# Patient Record
Sex: Male | Born: 1965 | Race: White | Hispanic: No | Marital: Married | Smoking: Never smoker
Health system: Southern US, Community
[De-identification: ages and names within clinical notes are randomized; demographics above are authoritative.]

## PROBLEM LIST (undated history)

## (undated) DIAGNOSIS — G4700 Insomnia, unspecified: Secondary | ICD-10-CM

## (undated) DIAGNOSIS — S5290XA Unspecified fracture of unspecified forearm, initial encounter for closed fracture: Secondary | ICD-10-CM

## (undated) DIAGNOSIS — N200 Calculus of kidney: Secondary | ICD-10-CM

## (undated) DIAGNOSIS — N138 Other obstructive and reflux uropathy: Secondary | ICD-10-CM

## (undated) DIAGNOSIS — N401 Enlarged prostate with lower urinary tract symptoms: Secondary | ICD-10-CM

## (undated) DIAGNOSIS — I10 Essential (primary) hypertension: Secondary | ICD-10-CM

## (undated) DIAGNOSIS — G43909 Migraine, unspecified, not intractable, without status migrainosus: Secondary | ICD-10-CM

## (undated) HISTORY — DX: Insomnia, unspecified: G47.00

## (undated) HISTORY — PX: CERVICAL DISCECTOMY: SHX98

## (undated) HISTORY — DX: Unspecified fracture of unspecified forearm, initial encounter for closed fracture: S52.90XA

## (undated) HISTORY — DX: Migraine, unspecified, not intractable, without status migrainosus: G43.909

## (undated) HISTORY — DX: Other obstructive and reflux uropathy: N13.8

## (undated) HISTORY — DX: Benign prostatic hyperplasia with lower urinary tract symptoms: N40.1

## (undated) HISTORY — DX: Calculus of kidney: N20.0

## (undated) HISTORY — DX: Essential (primary) hypertension: I10

---

## 2001-07-23 ENCOUNTER — Encounter: Payer: Self-pay | Admitting: Neurosurgery

## 2001-07-23 ENCOUNTER — Ambulatory Visit (HOSPITAL_COMMUNITY): Admission: RE | Admit: 2001-07-23 | Discharge: 2001-07-23 | Payer: Self-pay | Admitting: Neurosurgery

## 2001-08-24 ENCOUNTER — Encounter: Payer: Self-pay | Admitting: Neurosurgery

## 2001-08-24 ENCOUNTER — Encounter: Admission: RE | Admit: 2001-08-24 | Discharge: 2001-08-24 | Payer: Self-pay | Admitting: Neurosurgery

## 2001-09-16 ENCOUNTER — Encounter: Admission: RE | Admit: 2001-09-16 | Discharge: 2001-09-16 | Payer: Self-pay | Admitting: Neurosurgery

## 2001-09-16 ENCOUNTER — Encounter: Payer: Self-pay | Admitting: Neurosurgery

## 2001-10-01 ENCOUNTER — Encounter: Payer: Self-pay | Admitting: Neurosurgery

## 2001-10-01 ENCOUNTER — Encounter: Admission: RE | Admit: 2001-10-01 | Discharge: 2001-10-01 | Payer: Self-pay | Admitting: Neurosurgery

## 2004-12-23 ENCOUNTER — Encounter: Admission: RE | Admit: 2004-12-23 | Discharge: 2004-12-23 | Payer: Self-pay | Admitting: Neurosurgery

## 2006-01-16 ENCOUNTER — Ambulatory Visit: Payer: Self-pay | Admitting: Family Medicine

## 2006-12-09 ENCOUNTER — Telehealth: Payer: Self-pay | Admitting: Family Medicine

## 2007-06-01 ENCOUNTER — Telehealth: Payer: Self-pay | Admitting: Family Medicine

## 2007-06-30 ENCOUNTER — Ambulatory Visit: Payer: Self-pay | Admitting: Family Medicine

## 2007-06-30 ENCOUNTER — Telehealth: Payer: Self-pay | Admitting: Family Medicine

## 2007-06-30 DIAGNOSIS — G4700 Insomnia, unspecified: Secondary | ICD-10-CM

## 2007-07-06 ENCOUNTER — Encounter: Payer: Self-pay | Admitting: Family Medicine

## 2007-07-20 ENCOUNTER — Telehealth: Payer: Self-pay | Admitting: Family Medicine

## 2007-07-29 ENCOUNTER — Encounter: Admission: RE | Admit: 2007-07-29 | Discharge: 2007-07-29 | Payer: Self-pay | Admitting: Neurosurgery

## 2007-08-20 ENCOUNTER — Encounter: Admission: RE | Admit: 2007-08-20 | Discharge: 2007-08-20 | Payer: Self-pay | Admitting: Neurosurgery

## 2007-09-14 ENCOUNTER — Inpatient Hospital Stay (HOSPITAL_COMMUNITY): Admission: RE | Admit: 2007-09-14 | Discharge: 2007-09-16 | Payer: Self-pay | Admitting: Neurosurgery

## 2007-12-08 ENCOUNTER — Telehealth: Payer: Self-pay | Admitting: Family Medicine

## 2008-05-04 ENCOUNTER — Telehealth: Payer: Self-pay | Admitting: Family Medicine

## 2008-11-16 IMAGING — RF DG CERVICAL SPINE 2 OR 3 VIEWS
1 series · 2 of 2 positions shown · non-contrast
Comparison: None

CLINICAL DATA: C5-6 disc arthroplasty; cervical DDD

CERVICAL SPINE - 2-3 VIEW

[Series 0: run · 2 of 2 slices shown]
[im 1/2]
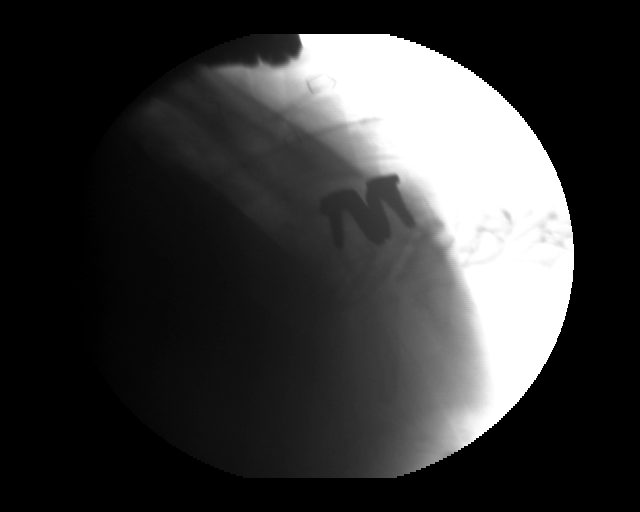
[im 2/2]
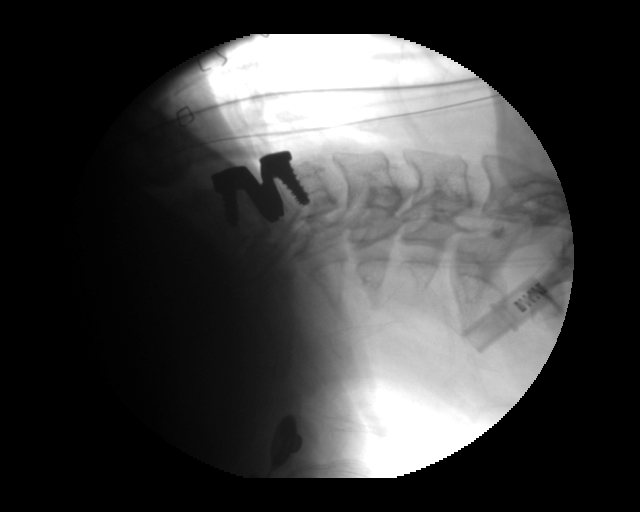

[2 of 2 positions shown; findings below may reference images not displayed]

FINDINGS: Two fluoroscopic-guided images from the operating room in
the lateral projection reveal changes of C5-6 disc arthroplasty
with screws entering the C5 and C6 bodies and prong-like
projections extending into the interspace.
IMPRESSION: Status post placement of C5-C6 body screws with disc implant.

## 2008-11-21 ENCOUNTER — Telehealth: Payer: Self-pay | Admitting: Family Medicine

## 2009-06-07 ENCOUNTER — Ambulatory Visit: Payer: Self-pay | Admitting: Family Medicine

## 2009-06-07 DIAGNOSIS — B029 Zoster without complications: Secondary | ICD-10-CM | POA: Insufficient documentation

## 2009-06-07 DIAGNOSIS — G43909 Migraine, unspecified, not intractable, without status migrainosus: Secondary | ICD-10-CM

## 2009-06-27 ENCOUNTER — Telehealth: Payer: Self-pay | Admitting: Family Medicine

## 2010-01-14 ENCOUNTER — Telehealth: Payer: Self-pay | Admitting: Family Medicine

## 2010-02-21 ENCOUNTER — Telehealth: Payer: Self-pay | Admitting: *Deleted

## 2010-04-09 NOTE — Assessment & Plan Note (Signed)
Summary: rash   Vital Signs:  Patient profile:   45 year old male Weight:      209 pounds Temp:     97.9 degrees F oral BP sitting:   116 / 84  (left arm) Cuff size:   regular  Vitals Entered By: Raechel Ache, RN (June 07, 2009 9:29 AM) CC: Developed achiness last week and skin tenderness L side; 3 days ago broke out in sore blisters on side. 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   History of Present Illness: Here for 4 days of an itchy burning rash on the left flank. Also he needs refills on meds.   Allergies: 1)  ! Penicillin  Past History:  Past Medical History: fx lt forearm insomnia migraines  Review of Systems  The patient denies anorexia, fever, weight loss, weight gain, vision loss, decreased hearing, hoarseness, chest pain, syncope, dyspnea on exertion, peripheral edema, prolonged cough, headaches, hemoptysis, abdominal pain, melena, hematochezia, severe indigestion/heartburn, hematuria, incontinence, genital sores, muscle weakness, suspicious skin lesions, transient blindness, difficulty walking, depression, unusual weight change, abnormal bleeding, enlarged lymph nodes, angioedema, breast masses, and testicular masses.    Physical Exam  General:  Well-developed,well-nourished,in no acute distress; alert,appropriate and cooperative throughout examination Lungs:  Normal respiratory effort, chest expands symmetrically. Lungs are clear to auscultation, no crackles or wheezes. Heart:  Normal rate and regular rhythm. S1 and S2 normal without gallop, murmur, click, rub or other extra sounds. Skin:  small patch of erythema on the left flank with a few vessicles   Impression & Recommendations:  Problem # 1:  SHINGLES (ICD-053.9)  Problem # 2:  INSOMNIA (ICD-780.52)  His updated medication list for this problem includes:    Temazepam 30 Mg Caps (Temazepam) .Marland Kitchen... 1 at bedtime prn  Problem # 3:  MIGRAINE HEADACHE (ICD-346.90)  His updated medication  list for this problem includes:    Imitrex 100 Mg Tabs (Sumatriptan succinate) ..... Once daily as needed  Complete Medication List: 1)  Imitrex 100 Mg Tabs (Sumatriptan succinate) .... Once daily as needed 2)  Nasonex 50 Mcg/act Susp (Mometasone furoate) .Marland Kitchen.. 1-2 each nostril qd 3)  Flonase 50 Mcg/act Susp (Fluticasone propionate) .... 2 sprays each day 4)  Temazepam 30 Mg Caps (Temazepam) .Marland Kitchen.. 1 at bedtime prn 5)  Prednisone (pak) 10 Mg Tabs (Prednisone) .... As directed for 12 days 6)  Valtrex 1 Gm Tabs (Valacyclovir hcl) .... Three times a day  Patient Instructions: 1)  This is shingles. Start on Valtrex and Prednisone. Refilled his other meds. Prescriptions: TEMAZEPAM 30 MG CAPS (TEMAZEPAM) 1 at bedtime prn  #30 x 5   Entered and Authorized by:   Nelwyn Salisbury MD   Signed by:   Nelwyn Salisbury MD on 06/07/2009   Method used:   Print then Give to Patient   RxID:   [id number] NASONEX 50 MCG/ACT SUSP (MOMETASONE FUROATE) 1-2 each nostril qd  #30 x 5   Entered and Authorized by:   Nelwyn Salisbury MD   Signed by:   Nelwyn Salisbury MD on 06/07/2009   Method used:   Print then Give to Patient   RxID:   [id number] IMITREX 100 MG TABS (SUMATRIPTAN SUCCINATE) once daily as needed  #12 x 5   Entered and Authorized by:   Nelwyn Salisbury MD   Signed by:   Nelwyn Salisbury MD on 06/07/2009   Method used:   Print then Give to Patient   RxID:  [id number] VALTREX 1 GM TABS (VALACYCLOVIR HCL) three times a day  #21 x 0   Entered and Authorized by:   Nelwyn Salisbury MD   Signed by:   Nelwyn Salisbury MD on 06/07/2009   Method used:   Print then Give to Patient   RxID:   [id number] PREDNISONE (PAK) 10 MG TABS (PREDNISONE) as directed for 12 days  #1 x 0   Entered and Authorized by:   Nelwyn Salisbury MD   Signed by:   Nelwyn Salisbury MD on 06/07/2009   Method used:   Print then Give to Patient   RxID:   [id number]

## 2010-04-09 NOTE — Progress Notes (Signed)
Summary: refill  Phone Note From Pharmacy   [facility] Call For: Jeffrey Kennedy  Summary of Call: refill temazepam 30mg  1 by mouth at bedtime as needed  Initial call taken by: Alfred Levins, CMA,  June 01, 2007 8:15 AM  Follow-up for Phone Call        call in #30 with 5 rf Follow-up by: Nelwyn Salisbury MD,  June 01, 2007 10:16 AM  Additional Follow-up for Phone Call Additional follow up Details #1::        Phone call completed, Pharmacist called Additional Follow-up by: Alfred Levins, CMA,  June 01, 2007 10:58 AM    New/Updated Medications: TEMAZEPAM 30 MG CAPS (TEMAZEPAM) 1 at bedtime prn   Prescriptions: TEMAZEPAM 30 MG CAPS (TEMAZEPAM) 1 at bedtime prn  #30 x 5   Entered by:   Alfred Levins, CMA   Authorized by:   Nelwyn Salisbury MD   Signed by:   Alfred Levins, CMA on 06/01/2007   Method used:   Telephoned to ...       [facility]       [address]       [place], [place]  [address]       Ph: [phone]       Fax: [phone]   RxID:   [id number]

## 2010-04-09 NOTE — Progress Notes (Signed)
Summary: refill ambien cr  Phone Note Call from Patient   Caller: Patient Call For: Nelwyn Salisbury MD Summary of Call: needs refill called to pharmacy instead of mail order- Ambien CR [facility] [place] [phone] Initial call taken by: Raechel Ache, RN,  June 27, 2009 4:48 PM  Follow-up for Phone Call        call in Ambien CR 12.5 at bedtime , #30 with 5 rf Follow-up by: Nelwyn Salisbury MD,  July 02, 2009 1:19 PM  Additional Follow-up for Phone Call Additional follow up Details #1::        Rx Called In Additional Follow-up by: Raechel Ache, RN,  July 02, 2009 1:44 PM    New/Updated Medications: AMBIEN CR 12.5 MG CR-TABS (ZOLPIDEM TARTRATE) 1 at bedtime Prescriptions: AMBIEN CR 12.5 MG CR-TABS (ZOLPIDEM TARTRATE) 1 at bedtime  #30 x 5   Entered by:   Raechel Ache, RN   Authorized by:   Nelwyn Salisbury MD   Signed by:   Raechel Ache, RN on 07/02/2009   Method used:   Historical   RxID:   [id number]

## 2010-04-09 NOTE — Progress Notes (Signed)
Summary: rx temazepam/ ambien  Phone Note Call from Patient   Caller: Spouse Summary of Call: wants temazapam refill and refill zolpidem er 12.5 mg  already taking [place]   but wife states he swithes between the both.  [facility]  Initial call taken by: Pura Spice, RN,  January 14, 2010 4:02 PM  Follow-up for Phone Call        no I cannot prescribe both of these, just one or the other. Which one works best for him? Follow-up by: Nelwyn Salisbury MD,  January 15, 2010 1:16 PM  Additional Follow-up for Phone Call Additional follow up Details #1::        mess left to return call  Additional Follow-up by: Pura Spice, RN,  January 15, 2010 2:46 PM    Additional Follow-up for Phone Call Additional follow up Details #2::    LEFT MESS TO RETURN CALL  Follow-up by: Pura Spice, RN,  January 15, 2010 5:15 PM  Additional Follow-up for Phone Call Additional follow up Details #3:: Details for Additional Follow-up Action Taken: pt wants Liborio Nixon, call in Ambien #30 with 5 rf     SF called pt  notified. .... Additional Follow-up by: Pura Spice, RN,  January 16, 2010 9:46 AM  New/Updated Medications: AMBIEN CR 12.5 MG CR-TABS (ZOLPIDEM TARTRATE) 1 at bedtime Prescriptions: AMBIEN CR 12.5 MG CR-TABS (ZOLPIDEM TARTRATE) 1 at bedtime  #30 x 5   Entered by:   Pura Spice, RN   Authorized by:   Nelwyn Salisbury MD   Signed by:   Pura Spice, RN on 01/16/2010   Method used:   Telephoned to ...       [facility] ([facility]ail)       [address]       [place], [place]  [address]       Ph: [phone]       Fax: [phone]   RxID:   [id number]

## 2010-04-11 NOTE — Progress Notes (Signed)
  Phone Note Call from Patient   Caller: Patient Call For: Nelwyn Salisbury MD Summary of Call: [facility] [phone] Pt alternates Ambien/Temazepam...........states he and Dr. Clent Ridges discussed this.  Needs refills on Temazepam. [phone] Initial call taken by: [facility] CMA AAMA,  February 21, 2010 11:42 AM  Follow-up for Phone Call        done Follow-up by: Nelwyn Salisbury MD,  February 21, 2010 11:59 AM  Additional Follow-up for Phone Call Additional follow up Details #1::        Rx is up front. Left message on machine about this. Additional Follow-up by: Romualdo Bolk, CMA (AAMA),  February 21, 2010 4:20 PM    New/Updated Medications: TEMAZEPAM 30 MG CAPS (TEMAZEPAM) at bedtime Prescriptions: TEMAZEPAM 30 MG CAPS (TEMAZEPAM) at bedtime  #30 x 5   Entered and Authorized by:   Nelwyn Salisbury MD   Signed by:   Nelwyn Salisbury MD on 02/21/2010   Method used:   Print then Give to Patient   RxID:   [id number]

## 2010-07-23 NOTE — H&P (Signed)
NAME:  Jeffrey Kennedy, Jeffrey Kennedy NO.:  [id number]   MEDICAL RECORD NO.:  [id number]          PATIENT TYPE:   LOCATION:                                 FACILITY:   PHYSICIAN:  Jeffrey Kennedy, M.D.           DATE OF BIRTH:   DATE OF ADMISSION:  09/14/2007  DATE OF DISCHARGE:                              HISTORY & PHYSICAL   ADMITTING DIAGNOSIS:  Cervical spondylosis, C5-C6.   HISTORY OF PRESENT ILLNESS:  This is a 45 year old right-handed white  gentleman who had been following since May 2003.  He had cervical  spondylosis of disk.  He has developed more neck pain and increased  spondylosis at C5-C6, and he is now admitted for disk arthroplasty.  His  medical history is fairly benign.  He has no long term medication, had  no operations.   ALLERGIES:  PENICILLIN.   SURGICAL HISTORY:  None.   SOCIAL HISTORY:  He does not smoke, drinks only socially.  He is a Occupational hygienist  for [employer].   FAMILY HISTORY:  Mother is 29 and okay health.  Father's history is not  given.   REVIEW OF SYSTEMS:  Remarkable for arm pain and neck pain.   PHYSICAL EXAMINATION:  HEENT:  Within Normal limits.  NECK:  He has reasonable range of motion of his neck and does not seem  to reproduce his shoulder and arm pain.  CHEST:  Clear.  CARDIAC:  Regular rate and rhythm.  ABDOMEN:  Nontender.  No hepatosplenomegaly.  GU:  Deferred.  EXTREMITIES: No clubbing or cyanosis. Peripheral pulses are good.  NEUROLOGICALLY:  He is awake, alert, and oriented.  Cranial nerves are  intact.  Motor exam shows 5/5 strength throughout the upper extremities.  There is no current sensory deficit.  The dysesthesia is described in  the left C5-C6 distribution.  Reflexes are 1 on the biceps, 1 on the  triceps, 1 on the brachioradialis.  Mikey Bussing is negative.  The lower  extremities are nonmyelopathic.   MRI shows spondylitic disease at C5-C6 with preserved motion.  He has  mild disk disease at C5-C6 and C6-C7 and in fact  herniated disk at C6-C7  that was managed conservatively, and has done well.  Major culprit is C5-  C6.   PLAN:  Disk arthroplasty, hopefully by preserving the motion, we will  prevent progression of transitional segment disease at C4-C5 and C6-C7.  The risks and benefits of this approach have been discussed with him,  and he wishes to proceed.           ______________________________  Jeffrey Kennedy, M.D.     MWR/MEDQ  D:  09/14/2007  T:  09/14/2007  Job:  [id number]

## 2010-07-23 NOTE — Op Note (Signed)
NAMESTEPHAN, NELIS NO.:  [id number]   MEDICAL RECORD NO.:  [id number]          PATIENT TYPE:  OIB   LOCATION:  3009                         FACILITY:  [facility]   PHYSICIAN:  Payton Doughty, M.D.      DATE OF BIRTH:  [birth year]   DATE OF PROCEDURE:  09/14/2007  DATE OF DISCHARGE:                               OPERATIVE REPORT   PREOPERATIVE DIAGNOSIS:  C5-6 spondylosis.   POSTOPERATIVE DIAGNOSIS:  C5-6 spondylosis.   OPERATIVE PROCEDURE:  C5-6 arthroplasty.   SURGEON:  Trey Sailors, MD   ANESTHESIA:  General endotracheal.   PREPARATION:  Prepped and draped with alcohol wipe.   COMPLICATIONS:  None.   NURSE ASSISTANT:  [place].   DOCTOR ASSISTANCE:  Hewitt Shorts, MD   BODY OF TEXT:  A 45 year old gentleman with spondylosis at C5-6 taken to  the operating room smoothly anesthetized and intubated, placed supine on  the operating table.  The neck very slightly extended in 5 pounds of  halter head traction.  Following shave, prep and drape in the usual  sterile fashion, the skin was incised from the midline in the medial  border of the sternocleidomastoid muscle at about the level of the  carotid tubercle.  The platysma was identified, elevated by and  undermined.  Sternocleidomastoid was identified.  Medial dissection  revealed the carotid artery retracted laterally to the left, trachea and  esophagus retracted laterally to the right exposing the bones of the  anterior cervical spine.  Marker was placed and fluoroscopy obtained to  confirm correctness level.  Diskectomy was carried out under gross  observation.  The microscope was then brought in.  We used  microdissection to remove bilaterally the posterior osteophytes and get  out into the neural foramina.  Posterior longitudinal ligament and  posterior annulus were also divided.  Retraction pins were placed in the  Caspar retractor and slight retraction was provided.  A 6 x 14 mm trial  was placed and  found to have a good fit, some trimming of osteophytes  was necessary.  Once the trial was placed, the shavers were introduced  and found to have good configuration of the anterior vertebral bodies.  A 6 x 14 mm prestige disk replacement was then placed.  Screws were  attached.  Intraoperative fluoroscopy showed good placement.  Final  tightener was applied.  The entire area was irrigated and hemostasis  assured.  Successive layers of 3-0 Vicryl and 4-0 Vicryl were used to  close.  Benzoin and Steri-Strips were placed and made occlusive Telfa  and OpSite and the patient returned to recovery room in good condition.   ADDENDUM:  Prior to closure of the esophagus, it was carefully inspected  and found to be free of lesions.   .           ______________________________  Payton Doughty, M.D.     MWR/MEDQ  D:  09/14/2007  T:  09/15/2007  Job:  [id number]

## 2010-11-15 ENCOUNTER — Telehealth: Payer: Self-pay | Admitting: Family Medicine

## 2010-11-15 MED ORDER — MOMETASONE FUROATE 50 MCG/ACT NA SUSP: 2.0000 | Freq: Every day | NASAL | Status: DC

## 2010-11-15 NOTE — Telephone Encounter (Signed)
Call in #30 with no refills. He needs an OV for any more after that

## 2010-11-15 NOTE — Telephone Encounter (Signed)
Refill request for Temazepam 30 mg take 1 po qhs. Pt last here on 04/09/10 and script last filled on 08/07/10.

## 2010-11-18 ENCOUNTER — Telehealth: Payer: Self-pay | Admitting: Family Medicine

## 2010-11-18 MED ORDER — TEMAZEPAM 30 MG PO CAPS: 30.0000 mg | ORAL_CAPSULE | Freq: Every evening | ORAL | Status: DC | PRN

## 2010-11-18 NOTE — Telephone Encounter (Signed)
Scripts called in and pt aware.

## 2010-11-18 NOTE — Telephone Encounter (Signed)
Script called in and pt aware. 

## 2010-12-05 LAB — COMPREHENSIVE METABOLIC PANEL
AST: 30
Albumin: 4.3
Calcium: 9.8
Creatinine, Ser: 1.09
GFR calc Af Amer: >60
GFR calc non Af Amer: >60
Total Protein: 7

## 2010-12-05 LAB — CBC
MCHC: 33.9
MCV: 89.6
Platelets: 235
RDW: 14.6

## 2010-12-05 LAB — URINALYSIS, ROUTINE W REFLEX MICROSCOPIC
Glucose, UA: NEGATIVE
Protein, ur: NEGATIVE

## 2010-12-05 LAB — URINE MICROSCOPIC-ADD ON

## 2010-12-05 LAB — DIFFERENTIAL
Eosinophils Relative: 2
Lymphocytes Relative: 34
Lymphs Abs: 2.5
Monocytes Absolute: 0.6
Monocytes Relative: 7

## 2010-12-05 LAB — PROTIME-INR: Prothrombin Time: 11.6

## 2011-12-23 ENCOUNTER — Ambulatory Visit: Payer: Self-pay | Admitting: Family Medicine

## 2011-12-23 ENCOUNTER — Encounter: Payer: Self-pay | Admitting: Family Medicine

## 2011-12-23 DIAGNOSIS — Z0289 Encounter for other administrative examinations: Secondary | ICD-10-CM

## 2012-01-08 ENCOUNTER — Ambulatory Visit: Payer: Self-pay | Admitting: Family Medicine

## 2012-01-26 ENCOUNTER — Ambulatory Visit: Payer: Self-pay | Admitting: Family Medicine

## 2012-02-02 ENCOUNTER — Encounter: Payer: Self-pay | Admitting: Family Medicine

## 2012-02-02 ENCOUNTER — Ambulatory Visit (INDEPENDENT_AMBULATORY_CARE_PROVIDER_SITE_OTHER): Payer: Managed Care, Other (non HMO) | Admitting: Family Medicine

## 2012-02-02 VITALS — BP 180/120 | HR 92 | Temp 98.6°F | Wt 208.0 lb

## 2012-02-02 DIAGNOSIS — R6889 Other general symptoms and signs: Secondary | ICD-10-CM

## 2012-02-02 DIAGNOSIS — I10 Essential (primary) hypertension: Secondary | ICD-10-CM

## 2012-02-02 DIAGNOSIS — R7989 Other specified abnormal findings of blood chemistry: Secondary | ICD-10-CM

## 2012-02-02 LAB — POCT URINALYSIS DIPSTICK
Glucose, UA: NEGATIVE
Nitrite, UA: NEGATIVE
Urobilinogen, UA: 0.2

## 2012-02-02 MED ORDER — METOPROLOL SUCCINATE ER 50 MG PO TB24: 50.0000 mg | ORAL_TABLET | Freq: Every day | ORAL | Status: DC

## 2012-02-02 MED ORDER — ZOLPIDEM TARTRATE 10 MG PO TABS: 10.0000 mg | ORAL_TABLET | Freq: Every evening | ORAL | Status: DC | PRN

## 2012-02-02 NOTE — Progress Notes (Signed)
  Subjective:    Patient ID: Jeffrey Kennedy, male    DOB: Jul 25, 1965, 46 y.o.   MRN: [id number]  HPI Here for elevated BP. We have not seen him in about 2 years, and his BP was fine at his last visit. However he has been told that his BP is quite high over the past 2 months. 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 He has barely been passed this year, but frequent high BP readings have been found. He admits to feeling a lot of stress lately. He denies any SOB or chest pains or HAs.    Review of Systems  Constitutional: Negative.   HENT: Negative.   Eyes: Negative.   Respiratory: Negative.   Cardiovascular: Negative.   Neurological: Negative.        Objective:   Physical Exam  Constitutional: He is oriented to person, place, and time. He appears well-developed and well-nourished.  Eyes: Conjunctivae normal are normal. Pupils are equal, round, and reactive to light.  Neck: No thyromegaly present.  Cardiovascular: Normal rate, regular rhythm, normal heart sounds and intact distal pulses.   Pulmonary/Chest: Effort normal and breath sounds normal.  Lymphadenopathy:    He has no cervical adenopathy.  Neurological: He is alert and oriented to person, place, and time.          Assessment & Plan:  He definitely has HTN and this must be treated. He does not use tobacco. He needs to get more exercise and to limit his sodium intake. Start on Metoprolol 50 mg daily. Recheck in 2 weeks. Get labs today

## 2012-02-03 LAB — CBC WITH DIFFERENTIAL/PLATELET
Basophils Absolute: 0.2 10*3/uL — ABNORMAL HIGH (ref 0.0–0.1)
Eosinophils Relative: 1.8 % (ref 0.0–5.0)
Monocytes Absolute: 0.5 10*3/uL (ref 0.1–1.0)
Monocytes Relative: 5.2 % (ref 3.0–12.0)
Neutrophils Relative %: 58.7 % (ref 43.0–77.0)
Platelets: 275 10*3/uL (ref 150.0–400.0)
WBC: 9.5 10*3/uL (ref 4.5–10.5)

## 2012-02-03 LAB — HEPATIC FUNCTION PANEL
Albumin: 4.1 g/dL (ref 3.5–5.2)
Bilirubin, Direct: 0.1 mg/dL (ref 0.0–0.3)
Total Protein: 7.4 g/dL (ref 6.0–8.3)

## 2012-02-03 LAB — BASIC METABOLIC PANEL
CO2: 30 mEq/L (ref 19–32)
Calcium: 9.4 mg/dL (ref 8.4–10.5)
Creatinine, Ser: 1.7 mg/dL — ABNORMAL HIGH (ref 0.4–1.5)
Glucose, Bld: 99 mg/dL (ref 70–99)

## 2012-02-03 LAB — TSH: TSH: 0.23 u[IU]/mL — ABNORMAL LOW (ref 0.35–5.50)

## 2012-02-04 ENCOUNTER — Encounter: Payer: Self-pay | Admitting: Family Medicine

## 2012-02-04 NOTE — Addendum Note (Signed)
Addended by: Gershon Crane A on: 02/04/2012 12:44 PM   Modules accepted: Orders

## 2012-02-04 NOTE — Progress Notes (Signed)
Quick Note:  I put new lab order in computer, left voice message for pt to schedule lab appointment and I put a copy of results in mail. ______

## 2012-02-04 NOTE — Addendum Note (Signed)
Addended by: Aniceto Boss A on: 02/04/2012 01:02 PM   Modules accepted: Orders

## 2012-02-18 ENCOUNTER — Encounter: Payer: Self-pay | Admitting: Family Medicine

## 2012-02-18 ENCOUNTER — Ambulatory Visit (INDEPENDENT_AMBULATORY_CARE_PROVIDER_SITE_OTHER): Payer: Managed Care, Other (non HMO) | Admitting: Family Medicine

## 2012-02-18 VITALS — BP 150/98 | HR 66 | Temp 98.5°F | Wt 206.0 lb

## 2012-02-18 DIAGNOSIS — R7989 Other specified abnormal findings of blood chemistry: Secondary | ICD-10-CM

## 2012-02-18 DIAGNOSIS — R6889 Other general symptoms and signs: Secondary | ICD-10-CM

## 2012-02-18 DIAGNOSIS — I10 Essential (primary) hypertension: Secondary | ICD-10-CM

## 2012-02-18 MED ORDER — METOPROLOL SUCCINATE ER 100 MG PO TB24: 100.0000 mg | ORAL_TABLET | Freq: Every day | ORAL | Status: DC

## 2012-02-19 ENCOUNTER — Encounter: Payer: Self-pay | Admitting: Family Medicine

## 2012-02-19 DIAGNOSIS — I10 Essential (primary) hypertension: Secondary | ICD-10-CM | POA: Insufficient documentation

## 2012-02-19 LAB — T3, FREE: T3, Free: 3.3 pg/mL (ref 2.3–4.2)

## 2012-02-19 LAB — T4, FREE: Free T4: 1.01 ng/dL (ref 0.60–1.60)

## 2012-02-19 NOTE — Progress Notes (Signed)
  Subjective:    Patient ID: Jeffrey Kennedy, male    DOB: [birth year], 46 y.o.   MRN: [id number]  HPI Here to follow up on HTN. We met 2 weeks ago when his BP was extremely high, up to 180/120. He was started on Metoprolol succinate, and he feels much better now. He feels more relaxed and he is sleeping well on Ambien. His labs were normal except for a slightly depressed TSH.    Review of Systems  Constitutional: Negative.   Respiratory: Negative.   Cardiovascular: Negative.   Neurological: Negative.        Objective:   Physical Exam  Constitutional: He appears well-developed and well-nourished. No distress.  Cardiovascular: Normal rate, regular rhythm, normal heart sounds and intact distal pulses.   Pulmonary/Chest: Effort normal and breath sounds normal.          Assessment & Plan:  Partially treated HTN. We will increase the Metoprolol to 100 mg daily. Check a free T3 and free T4 today. Recheck in 2 weeks.

## 2012-02-20 NOTE — Progress Notes (Signed)
Quick Note:  I spoke with pt ______ 

## 2012-03-05 ENCOUNTER — Ambulatory Visit: Payer: Managed Care, Other (non HMO) | Admitting: Family Medicine

## 2012-03-19 ENCOUNTER — Ambulatory Visit (INDEPENDENT_AMBULATORY_CARE_PROVIDER_SITE_OTHER): Payer: Managed Care, Other (non HMO) | Admitting: Family Medicine

## 2012-03-19 ENCOUNTER — Encounter: Payer: Self-pay | Admitting: Family Medicine

## 2012-03-19 VITALS — BP 150/94 | HR 63 | Temp 98.3°F | Wt 206.0 lb

## 2012-03-19 DIAGNOSIS — I10 Essential (primary) hypertension: Secondary | ICD-10-CM

## 2012-03-19 MED ORDER — LISINOPRIL 20 MG PO TABS: 20.0000 mg | ORAL_TABLET | Freq: Every day | ORAL | Status: DC

## 2012-03-19 NOTE — Progress Notes (Signed)
  Subjective:    Patient ID: Jeffrey Kennedy, male    DOB: Mar 22, 1965, 47 y.o.   MRN: [id number]  HPI Here to recheck HTN. He feels great and has no complaints. He has not checked his BP at home. He has been taking 100 mg a day of Metoprolol succinate. His BP today is unchanged from what it was last time here. 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    Review of Systems  Constitutional: Negative.   Respiratory: Negative.   Cardiovascular: Negative.   Neurological: Negative.        Objective:   Physical Exam  Constitutional: He appears well-developed and well-nourished.  Neck: No thyromegaly present.  Cardiovascular: Normal rate, regular rhythm, normal heart sounds and intact distal pulses.   Pulmonary/Chest: Effort normal and breath sounds normal.  Lymphadenopathy:    He has no cervical adenopathy.          Assessment & Plan:  We will add Lisinopril to his meds. Recheck in 3 weeks

## 2012-03-29 ENCOUNTER — Ambulatory Visit (INDEPENDENT_AMBULATORY_CARE_PROVIDER_SITE_OTHER): Payer: Managed Care, Other (non HMO) | Admitting: Family Medicine

## 2012-03-29 ENCOUNTER — Encounter: Payer: Self-pay | Admitting: Family Medicine

## 2012-03-29 VITALS — BP 140/88 | HR 60 | Temp 98.5°F | Wt 204.0 lb

## 2012-03-29 DIAGNOSIS — I10 Essential (primary) hypertension: Secondary | ICD-10-CM

## 2012-03-29 MED ORDER — TEMAZEPAM 30 MG PO CAPS: 30.0000 mg | ORAL_CAPSULE | Freq: Every evening | ORAL | Status: DC | PRN

## 2012-03-29 NOTE — Progress Notes (Signed)
  Subjective:    Patient ID: Jeffrey Kennedy, male    DOB: Jul 06, 1965, 47 y.o.   MRN: [id number]  HPI Here to follow up on BP. He feels great and he has lost a few more pounds.    Review of Systems  Constitutional: Negative.   Respiratory: Negative.   Cardiovascular: Negative.        Objective:   Physical Exam  Constitutional: He appears well-developed and well-nourished.  Cardiovascular: Normal rate, regular rhythm, normal heart sounds and intact distal pulses.   Pulmonary/Chest: Effort normal and breath sounds normal.  Lymphadenopathy:    He has no cervical adenopathy.          Assessment & Plan:  His HTN is now well controlled. 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 I will see him back in 3 months

## 2012-04-24 ENCOUNTER — Other Ambulatory Visit: Payer: Self-pay

## 2012-08-04 ENCOUNTER — Telehealth: Payer: Self-pay | Admitting: Family Medicine

## 2012-08-04 NOTE — Telephone Encounter (Signed)
Refill request for Zolpidem Tartrate 10 mg take 1 po qhs prn.

## 2012-08-05 MED ORDER — ZOLPIDEM TARTRATE 10 MG PO TABS: 10.0000 mg | ORAL_TABLET | Freq: Every evening | ORAL | Status: DC | PRN

## 2012-08-05 NOTE — Telephone Encounter (Signed)
I called in script 

## 2012-08-05 NOTE — Telephone Encounter (Signed)
Okay for 6 months 

## 2012-11-17 ENCOUNTER — Other Ambulatory Visit: Payer: Self-pay | Admitting: Family Medicine

## 2012-11-18 NOTE — Telephone Encounter (Signed)
Call in #30 with 5 rf 

## 2012-11-30 ENCOUNTER — Other Ambulatory Visit: Payer: Self-pay

## 2012-11-30 MED ORDER — MOMETASONE FUROATE 50 MCG/ACT NA SUSP: 2.0000 | Freq: Every day | NASAL | Status: DC

## 2012-12-06 ENCOUNTER — Telehealth: Payer: Self-pay | Admitting: Family Medicine

## 2012-12-06 MED ORDER — TRIAMCINOLONE ACETONIDE(NASAL) 55 MCG/ACT NA INHA: 2.0000 | Freq: Every day | NASAL | Status: DC

## 2012-12-06 NOTE — Telephone Encounter (Signed)
I sent script e-scribe. 

## 2012-12-06 NOTE — Telephone Encounter (Signed)
Switch to triamcinolone nasal sprays, use 2 puffs daily, call in one year supply

## 2012-12-06 NOTE — Telephone Encounter (Signed)
Mr. Jeffrey Kennedy for Nasonex was denied. Per his insurance, he must try 3 formulary alternatives. I have documentation that he has tried fluticasone and Flonase.  He must also try: budesonide & triamcinolone.

## 2013-01-13 ENCOUNTER — Other Ambulatory Visit: Payer: Self-pay

## 2013-01-21 ENCOUNTER — Encounter: Payer: Self-pay | Admitting: *Deleted

## 2013-01-24 ENCOUNTER — Ambulatory Visit: Payer: Managed Care, Other (non HMO) | Admitting: Family Medicine

## 2013-01-24 DIAGNOSIS — Z0289 Encounter for other administrative examinations: Secondary | ICD-10-CM

## 2013-03-01 ENCOUNTER — Other Ambulatory Visit: Payer: Self-pay | Admitting: Family Medicine

## 2013-03-20 ENCOUNTER — Other Ambulatory Visit: Payer: Self-pay | Admitting: Family Medicine

## 2013-04-25 ENCOUNTER — Other Ambulatory Visit: Payer: Self-pay | Admitting: Family Medicine

## 2013-05-23 ENCOUNTER — Telehealth: Payer: Self-pay | Admitting: Family Medicine

## 2013-05-23 NOTE — Telephone Encounter (Signed)
[facility], [place], [place] [address] Requesting re-fill on temazepam (RESTORIL) 30 MG capsule

## 2013-05-24 NOTE — Telephone Encounter (Signed)
Call in #30 only, he needs an OV soon  

## 2013-05-25 MED ORDER — TEMAZEPAM 30 MG PO CAPS: ORAL_CAPSULE | ORAL | Status: DC

## 2013-05-25 NOTE — Telephone Encounter (Signed)
I called in script 

## 2013-05-31 ENCOUNTER — Telehealth: Payer: Self-pay | Admitting: Family Medicine

## 2013-05-31 MED ORDER — LISINOPRIL 20 MG PO TABS: ORAL_TABLET | ORAL | Status: DC

## 2013-05-31 MED ORDER — METOPROLOL SUCCINATE ER 100 MG PO TB24: ORAL_TABLET | ORAL | Status: DC

## 2013-05-31 NOTE — Telephone Encounter (Signed)
[facility], [place], [place] [address] Is requesting re-fills on the following:   lisinopril (PRINIVIL,ZESTRIL) 20 MG tablet metoprolol succinate (TOPROL-XL) 100 MG 24 hr tablet

## 2013-05-31 NOTE — Telephone Encounter (Signed)
Rx sent to pharmacy for 6 months.  

## 2013-06-21 HISTORY — PX: LITHOTRIPSY: SUR834

## 2013-06-28 ENCOUNTER — Ambulatory Visit (INDEPENDENT_AMBULATORY_CARE_PROVIDER_SITE_OTHER): Payer: 59 | Admitting: Family Medicine

## 2013-06-28 ENCOUNTER — Encounter: Payer: Self-pay | Admitting: Family Medicine

## 2013-06-28 VITALS — BP 132/90 | Temp 98.1°F | Ht 69.0 in | Wt 204.0 lb

## 2013-06-28 DIAGNOSIS — G4700 Insomnia, unspecified: Secondary | ICD-10-CM

## 2013-06-28 DIAGNOSIS — N200 Calculus of kidney: Secondary | ICD-10-CM

## 2013-06-28 DIAGNOSIS — I10 Essential (primary) hypertension: Secondary | ICD-10-CM

## 2013-06-28 MED ORDER — TERBINAFINE HCL 250 MG PO TABS: 250.0000 mg | ORAL_TABLET | Freq: Every day | ORAL | Status: DC

## 2013-06-28 MED ORDER — TEMAZEPAM 30 MG PO CAPS: ORAL_CAPSULE | ORAL | Status: DC

## 2013-06-28 NOTE — Progress Notes (Signed)
   Subjective:    Patient ID: Jeffrey Kennedy, male    DOB: 07-26-1965, 48 y.o.   MRN: [id number]  HPI Here to follow up. He has been dealing with bilateral kidney stones the past month, seeing Dr. Clyde Lundborg at [facility]. He had lithotripsy on the left kidney on 06-21-13, and he is scheduled to have this on the left kidney on 07-13-13. He is drinking lots of water and is taking Flomax. He has passed a number of these apparently. 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 Other than that he has felt well and his BP has been stable.    Review of Systems  Constitutional: Negative.   Respiratory: Negative.   Cardiovascular: Negative.   Gastrointestinal: Negative.   Genitourinary: Negative.        Objective:   Physical Exam  Constitutional: He appears well-developed and well-nourished.  Cardiovascular: Normal rate, regular rhythm, normal heart sounds and intact distal pulses.   Pulmonary/Chest: Effort normal and breath sounds normal.          Assessment & Plan:  His HTN is stable. He will continue to treat the kidney stones as above. 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

## 2013-06-28 NOTE — Progress Notes (Signed)
Pre visit review using our clinic review tool, if applicable. No additional management support is needed unless otherwise documented below in the visit note. 

## 2013-07-28 ENCOUNTER — Encounter: Payer: Self-pay | Admitting: Family Medicine

## 2013-10-19 ENCOUNTER — Other Ambulatory Visit: Payer: Self-pay | Admitting: Family Medicine

## 2013-12-20 ENCOUNTER — Telehealth: Payer: Self-pay | Admitting: Family Medicine

## 2013-12-20 NOTE — Telephone Encounter (Signed)
[facility], [place], [place] [address] Is requesting re-fill on temazepam (RESTORIL) 30 MG capsule

## 2013-12-20 NOTE — Telephone Encounter (Signed)
Call in #30 with 5 rf 

## 2013-12-21 MED ORDER — TEMAZEPAM 30 MG PO CAPS: ORAL_CAPSULE | ORAL | Status: DC

## 2013-12-21 NOTE — Telephone Encounter (Signed)
I called in script 

## 2013-12-23 ENCOUNTER — Other Ambulatory Visit: Payer: Self-pay

## 2014-02-14 ENCOUNTER — Telehealth: Payer: Self-pay

## 2014-02-14 NOTE — Telephone Encounter (Signed)
Rx request for:  Metoprolol succ ER 100 mg- Take 1 tablet every day with a meal.  Lisinopril 20 mg tablet- Take 1 tablet by mouth every day.   Pharm:  [facility]  Pls Advise.

## 2014-02-15 MED ORDER — METOPROLOL SUCCINATE ER 100 MG PO TB24: ORAL_TABLET | ORAL | Status: DC

## 2014-02-15 MED ORDER — LISINOPRIL 20 MG PO TABS: 20.0000 mg | ORAL_TABLET | Freq: Every day | ORAL | Status: DC

## 2014-02-15 NOTE — Telephone Encounter (Signed)
I sent scripts e-scribe. 

## 2014-06-26 ENCOUNTER — Telehealth: Payer: Self-pay | Admitting: Family Medicine

## 2014-06-26 MED ORDER — LISINOPRIL 20 MG PO TABS: 20.0000 mg | ORAL_TABLET | Freq: Every day | ORAL | Status: DC

## 2014-06-26 MED ORDER — TEMAZEPAM 30 MG PO CAPS: ORAL_CAPSULE | ORAL | Status: DC

## 2014-06-26 MED ORDER — METOPROLOL SUCCINATE ER 100 MG PO TB24: ORAL_TABLET | ORAL | Status: DC

## 2014-06-26 NOTE — Telephone Encounter (Signed)
Refill request for Lisinopril, Metoprolol, & Restoril. Pt has scheduled a office visit for here in May 2016. Per Dr. Sarajane Jews okay to refill these scripts, which I did send to [facility].

## 2014-06-26 NOTE — Telephone Encounter (Signed)
duplicate

## 2014-08-04 ENCOUNTER — Ambulatory Visit: Payer: Self-pay | Admitting: Family Medicine

## 2014-08-21 ENCOUNTER — Ambulatory Visit: Payer: Self-pay | Admitting: Family Medicine

## 2014-08-31 ENCOUNTER — Ambulatory Visit (INDEPENDENT_AMBULATORY_CARE_PROVIDER_SITE_OTHER): Payer: 59 | Admitting: Family Medicine

## 2014-08-31 ENCOUNTER — Encounter: Payer: Self-pay | Admitting: Family Medicine

## 2014-08-31 VITALS — BP 129/84 | HR 56 | Temp 98.3°F | Ht 69.0 in | Wt 203.0 lb

## 2014-08-31 DIAGNOSIS — I10 Essential (primary) hypertension: Secondary | ICD-10-CM

## 2014-08-31 MED ORDER — METOPROLOL SUCCINATE ER 100 MG PO TB24: ORAL_TABLET | ORAL | Status: DC

## 2014-08-31 MED ORDER — LISINOPRIL 20 MG PO TABS: 20.0000 mg | ORAL_TABLET | Freq: Every day | ORAL | Status: DC

## 2014-08-31 MED ORDER — TRIAMCINOLONE ACETONIDE 55 MCG/ACT NA AERO: 2.0000 | INHALATION_SPRAY | Freq: Every day | NASAL | Status: DC

## 2014-08-31 NOTE — Progress Notes (Signed)
Pre visit review using our clinic review tool, if applicable. No additional management support is needed unless otherwise documented below in the visit note. 

## 2014-09-04 ENCOUNTER — Encounter: Payer: Self-pay | Admitting: Family Medicine

## 2014-09-04 NOTE — Progress Notes (Signed)
   Subjective:    Patient ID: Jeffrey Kennedy, male    DOB: 1965/11/29, 49 y.o.   MRN: [id number]  HPI Here to recheck HTN. His BP at home is stable in the 120s or 130s over 80s. He feels well.    Review of Systems  Constitutional: Negative.   Respiratory: Negative.   Cardiovascular: Negative.   Neurological: Negative.        Objective:   Physical Exam  Constitutional: He is oriented to person, place, and time. He appears well-developed and well-nourished.  Cardiovascular: Normal rate, regular rhythm, normal heart sounds and intact distal pulses.   Pulmonary/Chest: Effort normal and breath sounds normal.  Neurological: He is alert and oriented to person, place, and time.          Assessment & Plan:  Stable HTN. Refilled meds.

## 2014-12-20 ENCOUNTER — Telehealth: Payer: Self-pay

## 2014-12-20 NOTE — Telephone Encounter (Signed)
Call in #30 with 5 rf 

## 2014-12-20 NOTE — Telephone Encounter (Signed)
Refill request for Temazepam 30mg .

## 2014-12-21 MED ORDER — TEMAZEPAM 30 MG PO CAPS: ORAL_CAPSULE | ORAL | Status: DC

## 2014-12-21 NOTE — Addendum Note (Signed)
Addended by: Aggie Hacker A on: 12/21/2014 12:11 PM   Modules accepted: Orders

## 2014-12-21 NOTE — Telephone Encounter (Signed)
I called in script 

## 2015-06-11 ENCOUNTER — Telehealth: Payer: Self-pay | Admitting: Family Medicine

## 2015-06-11 NOTE — Telephone Encounter (Signed)
Refill request for Temazepam 30 mg and send to [facility].

## 2015-06-11 NOTE — Telephone Encounter (Signed)
Call in #30 with 5 rf 

## 2015-06-12 MED ORDER — TEMAZEPAM 30 MG PO CAPS: ORAL_CAPSULE | ORAL | Status: DC

## 2015-06-12 NOTE — Telephone Encounter (Signed)
Rx was call in  

## 2015-09-08 ENCOUNTER — Other Ambulatory Visit: Payer: Self-pay | Admitting: Family Medicine

## 2015-09-10 NOTE — Telephone Encounter (Signed)
Refill sent to pharmacy.   

## 2015-09-13 ENCOUNTER — Other Ambulatory Visit: Payer: Self-pay

## 2015-09-13 MED ORDER — LISINOPRIL 20 MG PO TABS: 20.0000 mg | ORAL_TABLET | Freq: Every day | ORAL | Status: DC

## 2015-09-13 MED ORDER — METOPROLOL SUCCINATE ER 100 MG PO TB24: ORAL_TABLET | ORAL | Status: DC

## 2015-12-25 ENCOUNTER — Other Ambulatory Visit: Payer: Self-pay | Admitting: Family Medicine

## 2015-12-25 MED ORDER — LISINOPRIL 20 MG PO TABS: 20.0000 mg | ORAL_TABLET | Freq: Every day | ORAL | 0 refills | Status: DC

## 2015-12-25 NOTE — Telephone Encounter (Signed)
Pt needs refill on Lisinopril and send to [facility]. I sent script e-scribe for 30 day supply and left a voice message for pt to schedule office visit.

## 2016-01-21 ENCOUNTER — Other Ambulatory Visit: Payer: Self-pay | Admitting: Family Medicine

## 2016-01-21 MED ORDER — LISINOPRIL 20 MG PO TABS: 20.0000 mg | ORAL_TABLET | Freq: Every day | ORAL | 0 refills | Status: DC

## 2016-01-21 NOTE — Telephone Encounter (Signed)
Refill request for Lisinopril 20 mg take 1 po qd and send to [facility]. I sent script e-scribe and made a note on script, pt needs a office visit for future refills.

## 2016-02-20 ENCOUNTER — Encounter: Payer: Self-pay | Admitting: Family Medicine

## 2016-02-20 ENCOUNTER — Ambulatory Visit (INDEPENDENT_AMBULATORY_CARE_PROVIDER_SITE_OTHER): Payer: 59 | Admitting: Family Medicine

## 2016-02-20 VITALS — BP 148/92 | HR 66 | Temp 97.9°F | Ht 69.0 in | Wt 227.0 lb

## 2016-02-20 DIAGNOSIS — G4700 Insomnia, unspecified: Secondary | ICD-10-CM

## 2016-02-20 DIAGNOSIS — I10 Essential (primary) hypertension: Secondary | ICD-10-CM

## 2016-02-20 MED ORDER — LISINOPRIL 20 MG PO TABS: 20.0000 mg | ORAL_TABLET | Freq: Every day | ORAL | 3 refills | Status: DC

## 2016-02-20 MED ORDER — TEMAZEPAM 30 MG PO CAPS: ORAL_CAPSULE | ORAL | 1 refills | Status: DC

## 2016-02-20 MED ORDER — LISINOPRIL 20 MG PO TABS: 20.0000 mg | ORAL_TABLET | Freq: Every day | ORAL | 0 refills | Status: DC

## 2016-02-20 MED ORDER — METOPROLOL SUCCINATE ER 100 MG PO TB24: ORAL_TABLET | ORAL | 3 refills | Status: DC

## 2016-02-20 MED ORDER — METOPROLOL SUCCINATE ER 100 MG PO TB24: ORAL_TABLET | ORAL | 0 refills | Status: DC

## 2016-02-20 NOTE — Progress Notes (Signed)
   Subjective:    Patient ID: Jeffrey Kennedy, male    DOB: 1965/03/31, 50 y.o.   MRN: [id number]  HPI Here for  medication refills. He has been out of his BP meds for 2 months. He feels fine. 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    Review of Systems  Constitutional: Negative.   Respiratory: Negative.   Cardiovascular: Negative.   Neurological: Negative.        Objective:   Physical Exam  Constitutional: He is oriented to person, place, and time. He appears well-developed and well-nourished.  Cardiovascular: Normal rate, regular rhythm, normal heart sounds and intact distal pulses.   Pulmonary/Chest: Effort normal and breath sounds normal.  Musculoskeletal: He exhibits no edema.  Neurological: He is alert and oriented to person, place, and time.          Assessment & Plan:  His HTN is probably well controlled when he is on meds. Refilled the Lisinopril and Metoprolol. He will return in 2 months or so for a wellness exam.  Laurey Morale, MD

## 2016-02-20 NOTE — Progress Notes (Signed)
Pre visit review using our clinic review tool, if applicable. No additional management support is needed unless otherwise documented below in the visit note. 

## 2016-07-07 ENCOUNTER — Telehealth: Payer: Self-pay | Admitting: Family Medicine

## 2016-07-07 NOTE — Telephone Encounter (Signed)
Refill request for Temazepam 30 mg take 1 po qhs prn and send to [facility].

## 2016-07-08 MED ORDER — TEMAZEPAM 30 MG PO CAPS: ORAL_CAPSULE | ORAL | 1 refills | Status: DC

## 2016-07-08 NOTE — Telephone Encounter (Signed)
Pt prefers a 90 day and I did send call in script.

## 2016-07-08 NOTE — Telephone Encounter (Signed)
Call in #30 with 5 rf 

## 2016-11-27 ENCOUNTER — Encounter: Payer: Self-pay | Admitting: Family Medicine

## 2017-01-06 ENCOUNTER — Other Ambulatory Visit: Payer: Self-pay | Admitting: Family Medicine

## 2017-01-06 NOTE — Telephone Encounter (Signed)
Refill request for Temazepam 30 mg and a 90 day supply to [facility].

## 2017-01-06 NOTE — Telephone Encounter (Signed)
Call in #90 with 1 rf

## 2017-01-08 MED ORDER — TEMAZEPAM 30 MG PO CAPS: ORAL_CAPSULE | ORAL | 1 refills | Status: DC

## 2017-01-08 NOTE — Telephone Encounter (Signed)
I called in script to below pharmacy.

## 2017-04-02 ENCOUNTER — Telehealth: Payer: Self-pay

## 2017-04-02 NOTE — Telephone Encounter (Signed)
Refill request for metoprolol succ ER 100 MG

## 2017-04-02 NOTE — Telephone Encounter (Signed)
Refill lisinopril 20 MG tablets

## 2017-04-03 MED ORDER — METOPROLOL SUCCINATE ER 100 MG PO TB24: ORAL_TABLET | ORAL | 0 refills | Status: DC

## 2017-04-03 MED ORDER — LISINOPRIL 20 MG PO TABS: 20.0000 mg | ORAL_TABLET | Freq: Every day | ORAL | 0 refills | Status: DC

## 2017-04-03 NOTE — Telephone Encounter (Signed)
Call in 90 days of both with no rf. He needs an OV soon

## 2017-04-03 NOTE — Telephone Encounter (Signed)
Last OV 02/20/2016  Rx last refilled 02/20/2016 disp 90 with 3 refills.   Pt is due for [facility].  Sent to PCP for approval or advise if pt will need CPE office visit

## 2017-04-03 NOTE — Telephone Encounter (Signed)
Call in a 90 day supply of both with no rf. He will need an OV soon

## 2017-04-03 NOTE — Telephone Encounter (Signed)
Requesting a refill for Lisinopril as well FYI   Last refilled 02/20/2016 disp 90 with 3 refill

## 2017-04-03 NOTE — Telephone Encounter (Signed)
Called pt and left a VM that RX's were sent and advised pt that he needs to schedule for an OV son as well.

## 2017-06-01 ENCOUNTER — Encounter: Payer: Self-pay | Admitting: Family Medicine

## 2017-06-01 ENCOUNTER — Ambulatory Visit: Payer: 59 | Admitting: Family Medicine

## 2017-06-01 VITALS — BP 120/70 | HR 71 | Temp 97.9°F | Wt 220.8 lb

## 2017-06-01 DIAGNOSIS — G43909 Migraine, unspecified, not intractable, without status migrainosus: Secondary | ICD-10-CM | POA: Diagnosis not present

## 2017-06-01 DIAGNOSIS — I10 Essential (primary) hypertension: Secondary | ICD-10-CM | POA: Diagnosis not present

## 2017-06-01 DIAGNOSIS — G4700 Insomnia, unspecified: Secondary | ICD-10-CM

## 2017-06-01 MED ORDER — TEMAZEPAM 30 MG PO CAPS: ORAL_CAPSULE | ORAL | 1 refills | Status: DC

## 2017-06-01 MED ORDER — METOPROLOL SUCCINATE ER 100 MG PO TB24: ORAL_TABLET | ORAL | 3 refills | Status: DC

## 2017-06-01 MED ORDER — LISINOPRIL 20 MG PO TABS: 20.0000 mg | ORAL_TABLET | Freq: Every day | ORAL | 3 refills | Status: DC

## 2017-06-01 NOTE — Progress Notes (Signed)
   Subjective:    Patient ID: Jeffrey Kennedy, male    DOB: 1966-01-20, 52 y.o.   MRN: [id number]  HPI Here to follow up. He feels great. His headaches are well controlled. His BP is well managed. He sleeps well.    Review of Systems  Constitutional: Negative.   Respiratory: Negative.   Cardiovascular: Negative.   Neurological: Negative.        Objective:   Physical Exam  Constitutional: He is oriented to person, place, and time. He appears well-developed and well-nourished.  Neck: No thyromegaly present.  Cardiovascular: Normal rate, regular rhythm, normal heart sounds and intact distal pulses.  Pulmonary/Chest: Effort normal and breath sounds normal. No respiratory distress. He has no wheezes. He has no rales.  Musculoskeletal: He exhibits no edema.  Lymphadenopathy:    He has no cervical adenopathy.  Neurological: He is alert and oriented to person, place, and time.          Assessment & Plan:  HTN is stable. Get labs today. Headaches and insomnia are stable. We did discuss diet and exercise, he is aware he needs to lose some weight.  Alysia Penna, MD

## 2017-09-23 ENCOUNTER — Other Ambulatory Visit (INDEPENDENT_AMBULATORY_CARE_PROVIDER_SITE_OTHER): Payer: 59

## 2017-09-23 ENCOUNTER — Telehealth: Payer: Self-pay | Admitting: Family Medicine

## 2017-09-23 DIAGNOSIS — I10 Essential (primary) hypertension: Secondary | ICD-10-CM | POA: Diagnosis not present

## 2017-09-23 LAB — BASIC METABOLIC PANEL
BUN: 18 mg/dL (ref 6–23)
CHLORIDE: 105 meq/L (ref 96–112)
CO2: 28 mEq/L (ref 19–32)
CREATININE: 1.45 mg/dL (ref 0.40–1.50)
Calcium: 9.4 mg/dL (ref 8.4–10.5)
GFR: 54.19 mL/min — ABNORMAL LOW (ref >60.00)
GLUCOSE: 111 mg/dL — AB (ref 70–99)
Potassium: 4.4 mEq/L (ref 3.5–5.1)
Sodium: 141 mEq/L (ref 135–145)

## 2017-09-23 LAB — CBC WITH DIFFERENTIAL/PLATELET
BASOS ABS: 0 10*3/uL (ref 0.0–0.1)
Basophils Relative: 0.4 % (ref 0.0–3.0)
Eosinophils Absolute: 0.1 10*3/uL (ref 0.0–0.7)
Eosinophils Relative: 2.4 % (ref 0.0–5.0)
HEMATOCRIT: 45.9 % (ref 39.0–52.0)
Hemoglobin: 15.5 g/dL (ref 13.0–17.0)
LYMPHS PCT: 42.7 % (ref 12.0–46.0)
Lymphs Abs: 2.2 10*3/uL (ref 0.7–4.0)
MCHC: 33.7 g/dL (ref 30.0–36.0)
MCV: 87.9 fl (ref 78.0–100.0)
MONOS PCT: 9 % (ref 3.0–12.0)
Monocytes Absolute: 0.5 10*3/uL (ref 0.1–1.0)
NEUTROS ABS: 2.4 10*3/uL (ref 1.4–7.7)
Neutrophils Relative %: 45.5 % (ref 43.0–77.0)
PLATELETS: 217 10*3/uL (ref 150.0–400.0)
RBC: 5.23 Mil/uL (ref 4.22–5.81)
RDW: 14.5 % (ref 11.5–15.5)
WBC: 5.2 10*3/uL (ref 4.0–10.5)

## 2017-09-23 LAB — HEPATIC FUNCTION PANEL
ALBUMIN: 4.2 g/dL (ref 3.5–5.2)
ALT: 14 U/L (ref 0–53)
AST: 12 U/L (ref 0–37)
Alkaline Phosphatase: 52 U/L (ref 39–117)
Bilirubin, Direct: 0.1 mg/dL (ref 0.0–0.3)
Total Bilirubin: 0.4 mg/dL (ref 0.2–1.2)
Total Protein: 6.6 g/dL (ref 6.0–8.3)

## 2017-09-23 LAB — TSH: TSH: 1.66 u[IU]/mL (ref 0.35–4.50)

## 2017-09-24 NOTE — Telephone Encounter (Signed)
Done

## 2017-11-16 ENCOUNTER — Encounter: Payer: Self-pay | Admitting: Family Medicine

## 2017-11-16 NOTE — Telephone Encounter (Signed)
Last fill 06/01/17 Last OV 06/01/17  Ok to fill?

## 2017-11-17 ENCOUNTER — Other Ambulatory Visit: Payer: Self-pay

## 2017-11-17 MED ORDER — TEMAZEPAM 30 MG PO CAPS: ORAL_CAPSULE | ORAL | 5 refills | Status: DC

## 2017-11-17 NOTE — Telephone Encounter (Signed)
Call in #30 with 5 rf 

## 2018-04-30 ENCOUNTER — Ambulatory Visit: Payer: 59 | Admitting: Family Medicine

## 2018-05-03 ENCOUNTER — Ambulatory Visit: Payer: 59 | Admitting: Family Medicine

## 2018-05-03 ENCOUNTER — Encounter: Payer: Self-pay | Admitting: Family Medicine

## 2018-05-04 ENCOUNTER — Ambulatory Visit: Payer: 59 | Admitting: Family Medicine

## 2018-05-05 ENCOUNTER — Encounter: Payer: Self-pay | Admitting: Family Medicine

## 2018-05-05 ENCOUNTER — Ambulatory Visit: Payer: 59 | Admitting: Family Medicine

## 2018-05-05 VITALS — BP 118/76 | HR 61 | Temp 98.0°F | Wt 220.1 lb

## 2018-05-05 DIAGNOSIS — Z0000 Encounter for general adult medical examination without abnormal findings: Secondary | ICD-10-CM | POA: Diagnosis not present

## 2018-05-05 DIAGNOSIS — Z23 Encounter for immunization: Secondary | ICD-10-CM | POA: Diagnosis not present

## 2018-05-05 MED ORDER — LISINOPRIL 20 MG PO TABS: 20.0000 mg | ORAL_TABLET | Freq: Every day | ORAL | 3 refills | Status: DC

## 2018-05-05 MED ORDER — TRAZODONE HCL 100 MG PO TABS: 100.0000 mg | ORAL_TABLET | Freq: Every day | ORAL | 3 refills | Status: DC

## 2018-05-05 MED ORDER — METOPROLOL SUCCINATE ER 100 MG PO TB24: ORAL_TABLET | ORAL | 3 refills | Status: DC

## 2018-05-05 NOTE — Progress Notes (Signed)
   Subjective:    Patient ID: Jeffrey Kennedy, male    DOB: 1965-06-29, 53 y.o.   MRN: [id number]  HPI    Review of Systems     Objective:   Physical Exam        Assessment & Plan:

## 2018-05-05 NOTE — [place] Notes (Signed)
Subjective:    Patient ID: Jeffrey Kennedy, male    DOB: 1965/11/08, 53 y.o.   MRN: [id number]  HPI Here for a well exam. He feels great. His BP is stable. 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 He could lose his license if any controlled substances show up in the test.    Review of Systems  Constitutional: Negative.   HENT: Negative.   Eyes: Negative.   Respiratory: Negative.   Cardiovascular: Negative.   Gastrointestinal: Negative.   Genitourinary: Negative.   Musculoskeletal: Negative.   Skin: Negative.   Neurological: Negative.   Psychiatric/Behavioral: Negative.        Objective:   Physical Exam Constitutional:      General: He is not in acute distress.    Appearance: He is well-developed. He is not diaphoretic.  HENT:     Head: Normocephalic and atraumatic.     Right Ear: External ear normal.     Left Ear: External ear normal.     Nose: Nose normal.     Mouth/Throat:     Pharynx: No oropharyngeal exudate.  Eyes:     General: No scleral icterus.       Right eye: No discharge.        Left eye: No discharge.     Conjunctiva/sclera: Conjunctivae normal.     Pupils: Pupils are equal, round, and reactive to light.  Neck:     Musculoskeletal: Neck supple.     Thyroid: No thyromegaly.     Vascular: No JVD.     Trachea: No tracheal deviation.  Cardiovascular:     Rate and Rhythm: Normal rate and regular rhythm.     Heart sounds: Normal heart sounds. No murmur. No friction rub. No gallop.   Pulmonary:     Effort: Pulmonary effort is normal. No respiratory distress.     Breath sounds: Normal breath sounds. No wheezing or rales.  Chest:     Chest wall: No tenderness.  Abdominal:     General: Bowel sounds are normal. There is no distension.     Palpations: Abdomen is soft. There is no mass.     Tenderness: There is no abdominal tenderness. There is no guarding or rebound.   Genitourinary:    Penis: No tenderness.   Musculoskeletal: Normal range of motion.        General: No tenderness.  Lymphadenopathy:     Cervical: No cervical adenopathy.  Skin:    General: Skin is warm and dry.     Coloration: Skin is not pale.     Findings: No erythema or rash.  Neurological:     Mental Status: He is alert and oriented to person, place, and time.     Cranial Nerves: No cranial nerve deficit.     Motor: No abnormal muscle tone.     Coordination: Coordination normal.     Deep Tendon Reflexes: Reflexes are normal and symmetric. Reflexes normal.  Psychiatric:        Behavior: Behavior normal.        Thought Content: Thought content normal.        Judgment: Judgment normal.           Assessment & Plan:  Well exam. We discussed diet and exercise. He will return soon for fasting labs. Given his first flu shot today. Set up his first colonoscopy. For sleep he will stop Temazepam and try  Trazodone 100 mg qhs.  Alysia Penna, MD

## 2018-05-20 ENCOUNTER — Encounter: Payer: Self-pay | Admitting: Family Medicine

## 2018-05-21 NOTE — Telephone Encounter (Signed)
Per Dr. Sarajane Jews---  Cannot prescribe both of these meds.  Increase the trazodone to 200 mg at bedtime.

## 2018-06-01 ENCOUNTER — Other Ambulatory Visit: Payer: Self-pay | Admitting: Family Medicine

## 2018-06-07 ENCOUNTER — Encounter: Payer: Self-pay | Admitting: Family Medicine

## 2018-06-08 NOTE — Telephone Encounter (Signed)
Cancel the Trazodone and call in Temazepam 30 mg to take qhs, #90 with one rf

## 2018-06-08 NOTE — Telephone Encounter (Signed)
Dr. Sarajane Jews please advise on medication requested.  Thanks

## 2018-06-09 MED ORDER — TEMAZEPAM 30 MG PO CAPS: 30.0000 mg | ORAL_CAPSULE | Freq: Every evening | ORAL | 1 refills | Status: DC | PRN

## 2018-12-08 ENCOUNTER — Encounter: Payer: Self-pay | Admitting: Family Medicine

## 2018-12-08 MED ORDER — TEMAZEPAM 30 MG PO CAPS: 30.0000 mg | ORAL_CAPSULE | Freq: Every evening | ORAL | 1 refills | Status: DC | PRN

## 2019-01-10 ENCOUNTER — Encounter: Payer: Self-pay | Admitting: Family Medicine

## 2019-01-11 ENCOUNTER — Other Ambulatory Visit: Payer: 59

## 2019-01-17 ENCOUNTER — Other Ambulatory Visit: Payer: 59

## 2019-06-08 ENCOUNTER — Telehealth (INDEPENDENT_AMBULATORY_CARE_PROVIDER_SITE_OTHER): Payer: 59 | Admitting: Family Medicine

## 2019-06-08 ENCOUNTER — Telehealth: Payer: Self-pay | Admitting: Family Medicine

## 2019-06-08 ENCOUNTER — Encounter: Payer: Self-pay | Admitting: Family Medicine

## 2019-06-08 DIAGNOSIS — G4700 Insomnia, unspecified: Secondary | ICD-10-CM

## 2019-06-08 DIAGNOSIS — I10 Essential (primary) hypertension: Secondary | ICD-10-CM | POA: Diagnosis not present

## 2019-06-08 MED ORDER — LISINOPRIL 20 MG PO TABS: 20.0000 mg | ORAL_TABLET | Freq: Every day | ORAL | 3 refills | Status: DC

## 2019-06-08 MED ORDER — METOPROLOL SUCCINATE ER 100 MG PO TB24: ORAL_TABLET | ORAL | 3 refills | Status: DC

## 2019-06-08 MED ORDER — TEMAZEPAM 30 MG PO CAPS: 30.0000 mg | ORAL_CAPSULE | Freq: Every evening | ORAL | 1 refills | Status: DC | PRN

## 2019-06-08 NOTE — Telephone Encounter (Signed)
Pt called to confirm that  temazepam (RESTORIL) 30 MG capsule [id number]   was sent to pharmacy. I advised seeing presecretion being ordered with a start date of today. Pt informed that pharmacy said they had not received it.

## 2019-06-08 NOTE — Telephone Encounter (Signed)
I sent it again  

## 2019-06-08 NOTE — [place] Notes (Signed)
Subjective:    Patient ID: Jeffrey Kennedy, male    DOB: Feb 27, 1966, 54 y.o.   MRN: [id number]  HPI Virtual Visit via Video Note  I connected with the patient on 06/08/19 at 10:30 AM EDT by a video enabled telemedicine application and verified that I am speaking with the correct person using two identifiers.  Location patient: home Location provider:work or home office Persons participating in the virtual visit: patient, provider  I discussed the limitations of evaluation and management by telemedicine and the availability of in person appointments. The patient expressed understanding and agreed to proceed.   HPI: Here to follow up. He feels great. His BP is stable, and it was 128/82 several weeks ago at a flight physical. He sleeps well. 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    ROS: See pertinent positives and negatives per HPI.  Past Medical History:  Diagnosis Date  . BPH with urinary obstruction    sees Dr. Clyde Lundborg in [place]   . Forearm fracture    left  . Hypertension   . Insomnia   . Kidney stones    sees Dr. Clyde Lundborg in [place]   . Migraine     Past Surgical History:  Procedure Laterality Date  . LITHOTRIPSY  06-21-13   per Dr. Clyde Lundborg at [facility]     Family History  Problem Relation Age of Onset  . Lung cancer Other      Current Outpatient Medications:  .  aspirin-acetaminophen-caffeine (EXCEDRIN MIGRAINE) [id number] MG per tablet, Take 1 tablet by mouth every 6 (six) hours as needed., Disp: , Rfl:  .  lisinopril (PRINIVIL,ZESTRIL) 20 MG tablet, Take 1 tablet (20 mg total) by mouth daily., Disp: 90 tablet, Rfl: 3 .  metoprolol succinate (TOPROL-XL) 100 MG 24 hr tablet, TAKE ONE TABLET EVERY DAY WITH A MEAL, Disp: 90 tablet, Rfl: 3 .  Multiple Vitamin (MULTIVITAMIN) tablet, Take 1 tablet by mouth daily., Disp: , Rfl:  .  tamsulosin (FLOMAX) 0.4 MG CAPS capsule, Take 0.4 mg by mouth at  bedtime., Disp: , Rfl:  .  temazepam (RESTORIL) 30 MG capsule, Take 1 capsule (30 mg total) by mouth at bedtime as needed for sleep., Disp: 90 capsule, Rfl: 1 .  triamcinolone (NASACORT AQ) 55 MCG/ACT AERO nasal inhaler, Place 2 sprays into the nose daily., Disp: 1 Inhaler, Rfl: 11  EXAM:  VITALS per patient if applicable:  GENERAL: alert, oriented, appears well and in no acute distress  HEENT: atraumatic, conjunttiva clear, no obvious abnormalities on inspection of external nose and ears  NECK: normal movements of the head and neck  LUNGS: on inspection no signs of respiratory distress, breathing rate appears normal, no obvious gross SOB, gasping or wheezing  CV: no obvious cyanosis  MS: moves all visible extremities without noticeable abnormality  PSYCH/NEURO: pleasant and cooperative, no obvious depression or anxiety, speech and thought processing grossly intact  ASSESSMENT AND PLAN: His insomni and HTN are well controlled. Refill the meds.  Alysia Penna, MD  Discussed the following assessment and plan:  No diagnosis found.     I discussed the assessment and treatment plan with the patient. The patient was provided an opportunity to ask questions and all were answered. The patient agreed with the plan and demonstrated an understanding of the instructions.   The patient was advised to call back or seek an in-person evaluation if the symptoms worsen or if the  condition fails to improve as anticipated.     Review of Systems     Objective:   Physical Exam        Assessment & Plan:

## 2019-06-08 NOTE — Telephone Encounter (Signed)
Please advise, ok to resend rx?

## 2019-09-07 ENCOUNTER — Encounter: Payer: Self-pay | Admitting: Family Medicine

## 2019-09-07 NOTE — Telephone Encounter (Signed)
I did DC the other medications

## 2020-03-23 ENCOUNTER — Other Ambulatory Visit: Payer: Self-pay | Admitting: Family Medicine

## 2020-06-23 ENCOUNTER — Other Ambulatory Visit: Payer: Self-pay | Admitting: Family Medicine

## 2020-06-25 NOTE — Telephone Encounter (Signed)
Pt needs appointment for further refills 

## 2020-09-21 ENCOUNTER — Other Ambulatory Visit: Payer: Self-pay | Admitting: Family Medicine

## 2020-12-20 ENCOUNTER — Other Ambulatory Visit: Payer: Self-pay | Admitting: Family Medicine

## 2020-12-24 ENCOUNTER — Other Ambulatory Visit: Payer: Self-pay | Admitting: Family Medicine

## 2020-12-25 ENCOUNTER — Telehealth: Payer: Self-pay | Admitting: Family Medicine

## 2020-12-25 NOTE — Telephone Encounter (Signed)
Left detailed message for pt to call the office and schedule appointment for BP check and  medication refill

## 2020-12-25 NOTE — Telephone Encounter (Signed)
Pt call and stated he need a refill on lisinopril (ZESTRIL) 20 MG tablet  sent to  [facility], [address] Phone:  [phone]  Fax:  [phone]    Pt have a appt on 01/02/21

## 2020-12-26 NOTE — Telephone Encounter (Signed)
Pt has an appointment with his PCP

## 2020-12-27 NOTE — Telephone Encounter (Signed)
Pt refill for Lisinopril was sent to her pharmacy, pt has an appointment scheduled for 01/02/2021

## 2021-01-01 ENCOUNTER — Other Ambulatory Visit: Payer: Self-pay

## 2021-01-02 ENCOUNTER — Ambulatory Visit (INDEPENDENT_AMBULATORY_CARE_PROVIDER_SITE_OTHER): Payer: No Typology Code available for payment source | Admitting: Family Medicine

## 2021-01-02 ENCOUNTER — Encounter: Payer: Self-pay | Admitting: Family Medicine

## 2021-01-02 VITALS — BP 126/78 | HR 45 | Temp 98.0°F | Ht 69.0 in | Wt 220.0 lb

## 2021-01-02 DIAGNOSIS — Z0000 Encounter for general adult medical examination without abnormal findings: Secondary | ICD-10-CM | POA: Diagnosis not present

## 2021-01-02 DIAGNOSIS — Z23 Encounter for immunization: Secondary | ICD-10-CM

## 2021-01-02 LAB — LIPID PANEL
Cholesterol: 165 mg/dL (ref 0–200)
HDL: 36.8 mg/dL — ABNORMAL LOW (ref >39.00)
LDL Cholesterol: 101 mg/dL — ABNORMAL HIGH (ref 0–99)
NonHDL: 128.48
Total CHOL/HDL Ratio: 4
Triglycerides: 135 mg/dL (ref 0.0–149.0)
VLDL: 27 mg/dL (ref 0.0–40.0)

## 2021-01-02 LAB — CBC WITH DIFFERENTIAL/PLATELET
Basophils Absolute: 0 10*3/uL (ref 0.0–0.1)
Basophils Relative: 0.5 % (ref 0.0–3.0)
Eosinophils Absolute: 0.1 10*3/uL (ref 0.0–0.7)
Eosinophils Relative: 1.9 % (ref 0.0–5.0)
HCT: 48.3 % (ref 39.0–52.0)
Hemoglobin: 16 g/dL (ref 13.0–17.0)
Lymphocytes Relative: 40 % (ref 12.0–46.0)
Lymphs Abs: 2.1 10*3/uL (ref 0.7–4.0)
MCHC: 33.1 g/dL (ref 30.0–36.0)
MCV: 87.3 fl (ref 78.0–100.0)
Monocytes Absolute: 0.4 10*3/uL (ref 0.1–1.0)
Monocytes Relative: 8 % (ref 3.0–12.0)
Neutro Abs: 2.6 10*3/uL (ref 1.4–7.7)
Neutrophils Relative %: 49.6 % (ref 43.0–77.0)
Platelets: 192 10*3/uL (ref 150.0–400.0)
RBC: 5.53 Mil/uL (ref 4.22–5.81)
RDW: 14 % (ref 11.5–15.5)
WBC: 5.2 10*3/uL (ref 4.0–10.5)

## 2021-01-02 LAB — HEPATIC FUNCTION PANEL
ALT: 15 U/L (ref 0–53)
AST: 14 U/L (ref 0–37)
Albumin: 4 g/dL (ref 3.5–5.2)
Alkaline Phosphatase: 57 U/L (ref 39–117)
Bilirubin, Direct: 0.1 mg/dL (ref 0.0–0.3)
Total Bilirubin: 0.4 mg/dL (ref 0.2–1.2)
Total Protein: 6.2 g/dL (ref 6.0–8.3)

## 2021-01-02 LAB — BASIC METABOLIC PANEL
BUN: 15 mg/dL (ref 6–23)
CO2: 29 mEq/L (ref 19–32)
Calcium: 9.1 mg/dL (ref 8.4–10.5)
Chloride: 106 mEq/L (ref 96–112)
Creatinine, Ser: 1.42 mg/dL (ref 0.40–1.50)
GFR: 55.49 mL/min — ABNORMAL LOW (ref >60.00)
Glucose, Bld: 99 mg/dL (ref 70–99)
Potassium: 4.4 mEq/L (ref 3.5–5.1)
Sodium: 140 mEq/L (ref 135–145)

## 2021-01-02 LAB — TSH: TSH: 1.12 u[IU]/mL (ref 0.35–5.50)

## 2021-01-02 LAB — PSA: PSA: 1.43 ng/mL (ref 0.10–4.00)

## 2021-01-02 LAB — HEMOGLOBIN A1C: Hgb A1c MFr Bld: 5.7 % (ref 4.6–6.5)

## 2021-01-02 MED ORDER — LISINOPRIL 20 MG PO TABS: 20.0000 mg | ORAL_TABLET | Freq: Every day | ORAL | 3 refills | Status: DC

## 2021-01-02 NOTE — [place] Notes (Signed)
   Subjective:    Patient ID: Jeffrey Kennedy, male    DOB: 1965/04/01, 55 y.o.   MRN: [id number]  HPI Here for a well exam. He feels great. His BP has been stable.    Review of Systems  Constitutional: Negative.   HENT: Negative.    Eyes: Negative.   Respiratory: Negative.    Cardiovascular: Negative.   Gastrointestinal: Negative.   Genitourinary: Negative.   Musculoskeletal: Negative.   Skin: Negative.   Neurological: Negative.   Psychiatric/Behavioral: Negative.        Objective:   Physical Exam Constitutional:      General: He is not in acute distress.    Appearance: Normal appearance. He is well-developed. He is not diaphoretic.  HENT:     Head: Normocephalic and atraumatic.     Right Ear: External ear normal.     Left Ear: External ear normal.     Nose: Nose normal.     Mouth/Throat:     Pharynx: No oropharyngeal exudate.  Eyes:     General: No scleral icterus.       Right eye: No discharge.        Left eye: No discharge.     Conjunctiva/sclera: Conjunctivae normal.     Pupils: Pupils are equal, round, and reactive to light.  Neck:     Thyroid: No thyromegaly.     Vascular: No JVD.     Trachea: No tracheal deviation.  Cardiovascular:     Rate and Rhythm: Normal rate and regular rhythm.     Heart sounds: Normal heart sounds. No murmur heard.   No friction rub. No gallop.  Pulmonary:     Effort: Pulmonary effort is normal. No respiratory distress.     Breath sounds: Normal breath sounds. No wheezing or rales.  Chest:     Chest wall: No tenderness.  Abdominal:     General: Bowel sounds are normal. There is no distension.     Palpations: Abdomen is soft. There is no mass.     Tenderness: There is no abdominal tenderness. There is no guarding or rebound.  Genitourinary:    Penis: No tenderness.   Musculoskeletal:        General: No tenderness. Normal range of motion.     Cervical back: Neck supple.  Lymphadenopathy:     Cervical: No cervical adenopathy.   Skin:    General: Skin is warm and dry.     Coloration: Skin is not pale.     Findings: No erythema or rash.  Neurological:     Mental Status: He is alert and oriented to person, place, and time.     Cranial Nerves: No cranial nerve deficit.     Motor: No abnormal muscle tone.     Coordination: Coordination normal.     Deep Tendon Reflexes: Reflexes are normal and symmetric. Reflexes normal.  Psychiatric:        Behavior: Behavior normal.        Thought Content: Thought content normal.        Judgment: Judgment normal.          Assessment & Plan:  Well exam. We discussed diet and exercise. Get fasting labs. Set up his first colonoscopy.  Alysia Penna, MD

## 2021-01-02 NOTE — Addendum Note (Signed)
Addended by: Rosalyn Gess D on: 01/02/2021 11:12 AM   Modules accepted: Orders

## 2021-01-23 ENCOUNTER — Other Ambulatory Visit: Payer: Self-pay

## 2021-01-23 ENCOUNTER — Ambulatory Visit (AMBULATORY_SURGERY_CENTER): Payer: No Typology Code available for payment source | Admitting: *Deleted

## 2021-01-23 VITALS — Ht 69.0 in | Wt 215.0 lb

## 2021-01-23 DIAGNOSIS — Z1211 Encounter for screening for malignant neoplasm of colon: Secondary | ICD-10-CM

## 2021-01-23 MED ORDER — NA SULFATE-K SULFATE-MG SULF 17.5-3.13-1.6 GM/177ML PO SOLN
1.0000 | Freq: Once | ORAL | 0 refills | Status: AC
Start: 2021-01-23 — End: 2021-01-23

## 2021-01-23 NOTE — [place] Notes (Signed)
PV completed over the phone. Pt verified name, DOB, address and insurance during PV today.  Pt mailed instruction packet with copy of consent form to read and not return, and instructions.   Pt encouraged to call with questions or issues.  If pt has My chart, procedure instructions sent via My Chart   No egg or soy allergy known to patient  No issues known to pt with past sedation with any surgeries or procedures Patient denies ever being told they had issues or difficulty with intubation  No FH of Malignant Hyperthermia Pt is not on diet pills Pt is not on  home 02  Pt is not on blood thinners  Pt denies issues with constipation  No A fib or A flutter  Pt is not vaccinated  for Covid  NO PA's for preps discussed with pt In PV today  Discussed with pt there will be an out-of-pocket cost for prep and that varies from $0 to 70 +  dollars - pt verbalized understanding   Due to the COVID-19 pandemic we are asking patients to follow certain guidelines in PV and the [place]   Pt aware of COVID protocols and [facility] guidelines

## 2021-01-28 ENCOUNTER — Encounter: Payer: Self-pay | Admitting: Gastroenterology

## 2021-02-10 ENCOUNTER — Encounter: Payer: Self-pay | Admitting: Certified Registered Nurse Anesthetist

## 2021-02-11 ENCOUNTER — Ambulatory Visit (AMBULATORY_SURGERY_CENTER): Payer: No Typology Code available for payment source | Admitting: Gastroenterology

## 2021-02-11 ENCOUNTER — Encounter: Payer: Self-pay | Admitting: Gastroenterology

## 2021-02-11 ENCOUNTER — Other Ambulatory Visit: Payer: Self-pay

## 2021-02-11 VITALS — BP 134/84 | HR 68 | Temp 98.2°F | Resp 16 | Ht 69.0 in | Wt 212.0 lb

## 2021-02-11 DIAGNOSIS — K635 Polyp of colon: Secondary | ICD-10-CM | POA: Diagnosis not present

## 2021-02-11 DIAGNOSIS — Z1211 Encounter for screening for malignant neoplasm of colon: Secondary | ICD-10-CM

## 2021-02-11 DIAGNOSIS — D124 Benign neoplasm of descending colon: Secondary | ICD-10-CM

## 2021-02-11 HISTORY — PX: COLONOSCOPY: SHX174

## 2021-02-11 MED ORDER — SODIUM CHLORIDE 0.9 % IV SOLN: 500.0000 mL | Freq: Once | INTRAVENOUS | Status: DC

## 2021-02-11 NOTE — Patient Instructions (Signed)
Please read handouts provided. Continue present medications. Await pathology results.   YOU HAD AN ENDOSCOPIC PROCEDURE TODAY AT THE [facility]:   Refer to the procedure report that was given to you for any specific questions about what was found during the examination.  If the procedure report does not answer your questions, please call your gastroenterologist to clarify.  If you requested that your care partner not be given the details of your procedure findings, then the procedure report has been included in a sealed envelope for you to review at your convenience later.  YOU SHOULD EXPECT: Some feelings of bloating in the abdomen. Passage of more gas than usual.  Walking can help get rid of the air that was put into your GI tract during the procedure and reduce the bloating. If you had a lower endoscopy (such as a colonoscopy or flexible sigmoidoscopy) you may notice spotting of blood in your stool or on the toilet paper. If you underwent a bowel prep for your procedure, you may not have a normal bowel movement for a few days.  Please Note:  You might notice some irritation and congestion in your nose or some drainage.  This is from the oxygen used during your procedure.  There is no need for concern and it should clear up in a day or so.  SYMPTOMS TO REPORT IMMEDIATELY:  Following lower endoscopy (colonoscopy or flexible sigmoidoscopy):  Excessive amounts of blood in the stool  Significant tenderness or worsening of abdominal pains  Swelling of the abdomen that is new, acute  Fever of 100F or higher   For urgent or emergent issues, a gastroenterologist can be reached at any hour by calling [phone]. Do not use MyChart messaging for urgent concerns.    DIET:  We do recommend a small meal at first, but then you may proceed to your regular diet.  Drink plenty of fluids but you should avoid alcoholic beverages for 24 hours.  ACTIVITY:  You should plan to take it easy  for the rest of today and you should NOT DRIVE or use heavy machinery until tomorrow (because of the sedation medicines used during the test).    FOLLOW UP: Our staff will call the number listed on your records 48-72 hours following your procedure to check on you and address any questions or concerns that you may have regarding the information given to you following your procedure. If we do not reach you, we will leave a message.  We will attempt to reach you two times.  During this call, we will ask if you have developed any symptoms of COVID 19. If you develop any symptoms (ie: fever, flu-like symptoms, shortness of breath, cough etc.) before then, please call [phone].  If you test positive for Covid 19 in the 2 weeks post procedure, please call and report this information to us.    If any biopsies were taken you will be contacted by phone or by letter within the next 1-3 weeks.  Please call us at [phone] if you have not heard about the biopsies in 3 weeks.    SIGNATURES/CONFIDENTIALITY: You and/or your care partner have signed paperwork which will be entered into your electronic medical record.  These signatures attest to the fact that that the information above on your After Visit Summary has been reviewed and is understood.  Full responsibility of the confidentiality of this discharge information lies with you and/or your care-partner.  

## 2021-02-11 NOTE — [place] Notes (Signed)
Pt experienced laryngeal spasm with jaw thrust and PPV performed. Robinul 0.2 mg IV given due large amount of secretions upon assessment.  MD made aware, vss

## 2021-02-11 NOTE — [place] Notes (Signed)
Called to room to assist during endoscopic procedure.  Patient ID and intended procedure confirmed with present staff. Received instructions for my participation in the procedure from the performing physician.  

## 2021-02-11 NOTE — [place] Notes (Signed)
Report given to PACU, vss 

## 2021-02-11 NOTE — Progress Notes (Signed)
   Referring Provider: Laurey Morale, MD Primary Care Physician:  Laurey Morale, MD  Reason for Procedure:  Colon cancer screening   IMPRESSION:  Need for colon cancer screening Appropriate candidate for monitored anesthesia care  PLAN: Colonoscopy in the [place] today   HPI: Jeffrey Kennedy is a 55 y.o. male presents for screening colonoscopy.  No prior colonoscopy or colon cancer screening.  No baseline GI symptoms.   No known family history of colon cancer or polyps. No family history of uterine/endometrial cancer, pancreatic cancer or gastric/stomach cancer.   Past Medical History:  Diagnosis Date   BPH with urinary obstruction    sees Dr. Clyde Lundborg in [place]    BPH with urinary obstruction    sees Dr. Clyde Lundborg in [place], [place]   Forearm fracture    left x 2   Hypertension    controlled   Insomnia    past hx - not current 01-2021   Kidney stones    sees Dr. Clyde Lundborg in [place]     Past Surgical History:  Procedure Laterality Date   CERVICAL DISCECTOMY     LITHOTRIPSY  06/21/2013   per Dr. Clyde [facility] at [facility]     Current Outpatient Medications  Medication Sig Dispense Refill   lisinopril (ZESTRIL) 20 MG tablet Take 1 tablet (20 mg total) by mouth daily. 90 tablet 3   MAGNESIUM PO Take by mouth.     metoprolol succinate (TOPROL-XL) 100 MG 24 hr tablet TAKE ONE TABLET EVERY DAY WITH A MEAL 90 tablet 3   tamsulosin (FLOMAX) 0.4 MG CAPS capsule Take 0.4 mg by mouth at bedtime.     Current Facility-Administered Medications  Medication Dose Route Frequency Provider Last Rate Last Admin   0.9 %  sodium chloride infusion  500 mL Intravenous Once Thornton Park, MD        Allergies as of 02/11/2021 - Review Complete 02/11/2021  Allergen Reaction Noted   Penicillins  06/30/2007    Family History  Problem Relation Age of Onset   Lung cancer Other    Colon cancer Neg Hx    Colon polyps Neg Hx    Esophageal cancer Neg Hx    Rectal  cancer Neg Hx    Stomach cancer Neg Hx      Physical Exam: General:   Alert,  well-nourished, pleasant and cooperative in NAD Head:  Normocephalic and atraumatic. Eyes:  Sclera clear, no icterus.   Conjunctiva pink. Mouth:  No deformity or lesions.   Neck:  Supple; no masses or thyromegaly. Lungs:  Clear throughout to auscultation.   No wheezes. Heart:  Regular rate and rhythm; no murmurs. Abdomen:  Soft, non-tender, nondistended, normal bowel sounds, no rebound or guarding.  Msk:  Symmetrical. No boney deformities LAD: No inguinal or umbilical LAD Extremities:  No clubbing or edema. Neurologic:  Alert and  oriented x4;  grossly nonfocal Skin:  No obvious rash or bruise. Psych:  Alert and cooperative. Normal mood and affect.     Studies/Results: No results found.    Shondra Capps L. Tarri Glenn, MD, MPH 02/11/2021, 8:08 AM

## 2021-02-11 NOTE — Op Note (Signed)
[facility] Patient Name: Jeffrey Kennedy Procedure Date: 02/11/2021 8:10 AM MRN: [id number] Endoscopist: Thornton Park MD, MD Age: 55 Referring MD:  Date of Birth: Jul 16, 1965 Gender: Male Account #: [id number] Procedure:                Colonoscopy Indications:              Screening for colorectal malignant neoplasm, This                            is the patient's first colonoscopy                           No known family history of colon cancer or polyps Medicines:                Monitored Anesthesia Care Procedure:                Pre-Anesthesia Assessment:                           - Prior to the procedure, a History and Physical                            was performed, and patient medications and                            allergies were reviewed. The patient's tolerance of                            previous anesthesia was also reviewed. The risks                            and benefits of the procedure and the sedation                            options and risks were discussed with the patient.                            All questions were answered, and informed consent                            was obtained. Prior Anticoagulants: The patient has                            taken no previous anticoagulant or antiplatelet                            agents. ASA Grade Assessment: II - A patient with                            mild systemic disease. After reviewing the risks                            and benefits, the patient was deemed in  satisfactory condition to undergo the procedure.                           After obtaining informed consent, the colonoscope                            was passed under direct vision. Throughout the                            procedure, the patient's blood pressure, pulse, and                            oxygen saturations were monitored continuously. The                            Olympus CF-HQ190L  [id number]) Colonoscope was                            introduced through the anus and advanced to the 3                            cm into the ileum. A second forward view of the                            right colon was performed. The colonoscopy was                            performed without difficulty. The patient tolerated                            the procedure well. The quality of the bowel                            preparation was good. 944mL of yellow liquid was                            removed during the procedure. The terminal ileum,                            ileocecal valve, appendiceal orifice, and rectum                            were photographed. Scope In: 8:13:26 AM Scope Out: 8:31:27 AM Scope Withdrawal Time: 0 hours 15 minutes 50 seconds  Total Procedure Duration: 0 hours 18 minutes 1 second  Findings:                 The perianal and digital rectal examinations were                            normal.                           A less than 1 mm polyp was found in the proximal  descending colon. The polyp was flat. The polyp was                            removed with a cold biopsy forceps. Resection and                            retrieval were complete. Estimated blood loss was                            minimal.                           The exam was otherwise without abnormality on                            direct and retroflexion views except for small                            internal hemorrhoids. Complications:            No immediate complications except for coughing                            during the procedure. He bit his tongue while                            coughing in the recovery room. Estimated blood                            loss: Minimal. Estimated Blood Loss:     Estimated blood loss: none. Impression:               - One less than 1 mm polyp in the proximal                            descending colon, removed  with a cold biopsy                            forceps. Resected and retrieved.                           - Small internal hemorrhoids.                           - The examination was otherwise normal on direct                            and retroflexion views. Recommendation:           - Patient has a contact number available for                            emergencies. The signs and symptoms of potential                            delayed complications were discussed with the  patient. Return to normal activities tomorrow.                            Written discharge instructions were provided to the                            patient.                           - Resume previous diet.                           - Continue present medications.                           - Await pathology results.                           - Repeat colonoscopy date to be determined after                            pending pathology results are reviewed for                            surveillance.                           - Emerging evidence supports eating a diet of                            fruits, vegetables, grains, calcium, and yogurt                            while reducing red meat and alcohol may reduce the                            risk of colon cancer.                           - Thank you for allowing me to be involved in your                            colon cancer prevention. Thornton Park MD, MD 02/11/2021 8:37:59 AM This report has been signed electronically.

## 2021-02-11 NOTE — Progress Notes (Signed)
VS by CW  Pt's states no medical or surgical changes since previsit or office visit.  

## 2021-02-13 ENCOUNTER — Telehealth: Payer: Self-pay

## 2021-02-13 NOTE — Telephone Encounter (Signed)
  Follow up Call-  Call back number 02/11/2021  Post procedure Call Back phone  # [phone]  Permission to leave phone message Yes  Some recent data might be hidden     Patient questions:  Do you have a fever, pain , or abdominal swelling? No. Pain Score  0 *  Have you tolerated food without any problems? Yes.    Have you been able to return to your normal activities? Yes.    Do you have any questions about your discharge instructions: Diet   No. Medications  No. Follow up visit  No.  Do you have questions or concerns about your Care? No.  Actions: * If pain score is 4 or above: No action needed, pain <4.   Have you developed a fever since your procedure? no  2.   Have you had an respiratory symptoms (SOB or cough) since your procedure? no  3.   Have you tested positive for COVID 19 since your procedure no  4.   Have you had any family members/close contacts diagnosed with the COVID 19 since your procedure?  no   If yes to any of these questions please route to Joylene John, RN and Joella Prince, RN

## 2021-02-14 ENCOUNTER — Encounter: Payer: Self-pay | Admitting: Gastroenterology

## 2021-03-12 ENCOUNTER — Other Ambulatory Visit: Payer: Self-pay | Admitting: Family Medicine

## 2021-09-11 ENCOUNTER — Other Ambulatory Visit: Payer: Self-pay | Admitting: Family Medicine

## 2021-09-11 NOTE — Telephone Encounter (Signed)
Pt called to say he is in dire need of his medication. Pt just left pharmacy and they are saying they still need the authorization   lisinopril (ZESTRIL) 20 MG tablet  metoprolol succinate (TOPROL-XL) 100 MG 24 hr tablet   [facility] - [place], [place] - [address] Phone:  [phone]  Fax:  [phone]

## 2021-12-17 ENCOUNTER — Other Ambulatory Visit: Payer: Self-pay | Admitting: Family Medicine

## 2021-12-17 ENCOUNTER — Encounter: Payer: Self-pay | Admitting: Family Medicine

## 2021-12-17 ENCOUNTER — Other Ambulatory Visit: Payer: Self-pay

## 2021-12-17 DIAGNOSIS — I10 Essential (primary) hypertension: Secondary | ICD-10-CM

## 2021-12-17 MED ORDER — LISINOPRIL 20 MG PO TABS: 20.0000 mg | ORAL_TABLET | Freq: Every day | ORAL | 1 refills | Status: DC

## 2021-12-17 MED ORDER — METOPROLOL SUCCINATE ER 100 MG PO TB24: ORAL_TABLET | ORAL | 1 refills | Status: DC

## 2022-01-13 ENCOUNTER — Encounter: Payer: Self-pay | Admitting: Family Medicine

## 2022-01-24 ENCOUNTER — Ambulatory Visit (INDEPENDENT_AMBULATORY_CARE_PROVIDER_SITE_OTHER): Payer: No Typology Code available for payment source | Admitting: Family Medicine

## 2022-01-24 ENCOUNTER — Encounter: Payer: Self-pay | Admitting: Family Medicine

## 2022-01-24 VITALS — BP 120/82 | HR 52 | Temp 98.4°F | Ht 69.0 in | Wt 223.0 lb

## 2022-01-24 DIAGNOSIS — Z0000 Encounter for general adult medical examination without abnormal findings: Secondary | ICD-10-CM

## 2022-01-24 DIAGNOSIS — I10 Essential (primary) hypertension: Secondary | ICD-10-CM

## 2022-01-24 MED ORDER — METOPROLOL SUCCINATE ER 100 MG PO TB24: ORAL_TABLET | ORAL | 3 refills | Status: DC

## 2022-01-24 MED ORDER — LISINOPRIL 20 MG PO TABS: 20.0000 mg | ORAL_TABLET | Freq: Every day | ORAL | 3 refills | Status: DC

## 2022-01-24 NOTE — Progress Notes (Signed)
Subjective:    Patient ID: Jeffrey Kennedy, male    DOB: 1966/01/20, 56 y.o.   MRN: [id number]  HPI Here for a well exam. He feels fine. He had a kidney stone removed 2 weeks ago by Dr. Clyde Lundborg of [facility] in [place], [place]. This was successful.    Review of Systems  Constitutional: Negative.   HENT: Negative.    Eyes: Negative.   Respiratory: Negative.    Cardiovascular: Negative.   Gastrointestinal: Negative.   Genitourinary: Negative.   Musculoskeletal: Negative.   Skin: Negative.   Neurological: Negative.   Psychiatric/Behavioral: Negative.         Objective:   Physical Exam Constitutional:      General: He is not in acute distress.    Appearance: Normal appearance. He is well-developed. He is not diaphoretic.  HENT:     Head: Normocephalic and atraumatic.     Right Ear: External ear normal.     Left Ear: External ear normal.     Nose: Nose normal.     Mouth/Throat:     Pharynx: No oropharyngeal exudate.  Eyes:     General: No scleral icterus.       Right eye: No discharge.        Left eye: No discharge.     Conjunctiva/sclera: Conjunctivae normal.     Pupils: Pupils are equal, round, and reactive to light.  Neck:     Thyroid: No thyromegaly.     Vascular: No JVD.     Trachea: No tracheal deviation.  Cardiovascular:     Rate and Rhythm: Normal rate and regular rhythm.     Heart sounds: Normal heart sounds. No murmur heard.    No friction rub. No gallop.  Pulmonary:     Effort: Pulmonary effort is normal. No respiratory distress.     Breath sounds: Normal breath sounds. No wheezing or rales.  Chest:     Chest wall: No tenderness.  Abdominal:     General: Bowel sounds are normal. There is no distension.     Palpations: Abdomen is soft. There is no mass.     Tenderness: There is no abdominal tenderness. There is no guarding or rebound.  Genitourinary:    Penis: Normal. No tenderness.      Testes: Normal.     Prostate: Normal.     Rectum:  Normal. Guaiac result negative.  Musculoskeletal:        General: No tenderness. Normal range of motion.     Cervical back: Neck supple.  Lymphadenopathy:     Cervical: No cervical adenopathy.  Skin:    General: Skin is warm and dry.     Coloration: Skin is not pale.     Findings: No erythema or rash.  Neurological:     Mental Status: He is alert and oriented to person, place, and time.     Cranial Nerves: No cranial nerve deficit.     Motor: No abnormal muscle tone.     Coordination: Coordination normal.     Deep Tendon Reflexes: Reflexes are normal and symmetric. Reflexes normal.  Psychiatric:        Behavior: Behavior normal.        Thought Content: Thought content normal.        Judgment: Judgment normal.           Assessment & Plan:  Well exam. We discussed diet and exercise. He is not fasting today, so I wrote orders for a TSH,  PSA, and lipid panel for him to take to a [facility] site in [place] when he has the chance. We reviewed the labs from the recent urologic procedure, and this was notable only for slight renal insufficiency. His BUN was normal at 18, but the creatinine was 1.51, and the GFR was 54.  Alysia Penna, MD

## 2023-01-27 ENCOUNTER — Other Ambulatory Visit: Payer: Self-pay | Admitting: Family Medicine

## 2023-01-27 DIAGNOSIS — I10 Essential (primary) hypertension: Secondary | ICD-10-CM

## 2023-01-30 ENCOUNTER — Ambulatory Visit: Payer: No Typology Code available for payment source | Admitting: Family Medicine

## 2023-01-30 ENCOUNTER — Encounter: Payer: Self-pay | Admitting: Family Medicine

## 2023-01-30 VITALS — BP 122/80 | HR 52 | Temp 98.5°F | Ht 69.0 in | Wt 232.0 lb

## 2023-01-30 DIAGNOSIS — I10 Essential (primary) hypertension: Secondary | ICD-10-CM

## 2023-01-30 DIAGNOSIS — Z0000 Encounter for general adult medical examination without abnormal findings: Secondary | ICD-10-CM | POA: Diagnosis not present

## 2023-01-30 LAB — CBC WITH DIFFERENTIAL/PLATELET
Basophils Absolute: 0.1 10*3/uL (ref 0.0–0.1)
Basophils Relative: 0.8 % (ref 0.0–3.0)
Eosinophils Absolute: 0.1 10*3/uL (ref 0.0–0.7)
Eosinophils Relative: 1.9 % (ref 0.0–5.0)
HCT: 49.8 % (ref 39.0–52.0)
Hemoglobin: 16.5 g/dL (ref 13.0–17.0)
Lymphocytes Relative: 32.9 % (ref 12.0–46.0)
Lymphs Abs: 2.6 10*3/uL (ref 0.7–4.0)
MCHC: 33.2 g/dL (ref 30.0–36.0)
MCV: 88.9 fL (ref 78.0–100.0)
Monocytes Absolute: 0.6 10*3/uL (ref 0.1–1.0)
Monocytes Relative: 7.8 % (ref 3.0–12.0)
Neutro Abs: 4.4 10*3/uL (ref 1.4–7.7)
Neutrophils Relative %: 56.6 % (ref 43.0–77.0)
Platelets: 225 10*3/uL (ref 150.0–400.0)
RBC: 5.6 Mil/uL (ref 4.22–5.81)
RDW: 13.9 % (ref 11.5–15.5)
WBC: 7.8 10*3/uL (ref 4.0–10.5)

## 2023-01-30 LAB — BASIC METABOLIC PANEL
BUN: 14 mg/dL (ref 6–23)
CO2: 30 meq/L (ref 19–32)
Calcium: 9.2 mg/dL (ref 8.4–10.5)
Chloride: 103 meq/L (ref 96–112)
Creatinine, Ser: 1.39 mg/dL (ref 0.40–1.50)
GFR: 56.1 mL/min — ABNORMAL LOW (ref >60.00)
Glucose, Bld: 97 mg/dL (ref 70–99)
Potassium: 4 meq/L (ref 3.5–5.1)
Sodium: 141 meq/L (ref 135–145)

## 2023-01-30 LAB — HEPATIC FUNCTION PANEL
ALT: 17 U/L (ref 0–53)
AST: 15 U/L (ref 0–37)
Albumin: 4.3 g/dL (ref 3.5–5.2)
Alkaline Phosphatase: 68 U/L (ref 39–117)
Bilirubin, Direct: 0.1 mg/dL (ref 0.0–0.3)
Total Bilirubin: 0.5 mg/dL (ref 0.2–1.2)
Total Protein: 6.6 g/dL (ref 6.0–8.3)

## 2023-01-30 LAB — HEMOGLOBIN A1C: Hgb A1c MFr Bld: 5.6 % (ref 4.6–6.5)

## 2023-01-30 LAB — LIPID PANEL
Cholesterol: 193 mg/dL (ref 0–200)
HDL: 38.1 mg/dL — ABNORMAL LOW (ref >39.00)
LDL Cholesterol: 120 mg/dL — ABNORMAL HIGH (ref 0–99)
NonHDL: 155.28
Total CHOL/HDL Ratio: 5
Triglycerides: 175 mg/dL — ABNORMAL HIGH (ref 0.0–149.0)
VLDL: 35 mg/dL (ref 0.0–40.0)

## 2023-01-30 LAB — TSH: TSH: 1.45 u[IU]/mL (ref 0.35–5.50)

## 2023-01-30 LAB — PSA: PSA: 2.05 ng/mL (ref 0.10–4.00)

## 2023-01-30 MED ORDER — METOPROLOL SUCCINATE ER 100 MG PO TB24: ORAL_TABLET | ORAL | 3 refills | Status: DC

## 2023-01-30 MED ORDER — LISINOPRIL 20 MG PO TABS: 20.0000 mg | ORAL_TABLET | Freq: Every day | ORAL | 3 refills | Status: DC

## 2023-01-30 MED ORDER — TAMSULOSIN HCL 0.4 MG PO CAPS: 0.4000 mg | ORAL_CAPSULE | Freq: Every day | ORAL | 3 refills | Status: DC

## 2023-01-30 NOTE — Progress Notes (Signed)
Subjective:    Patient ID: Jeffrey Kennedy, male    DOB: 1965-11-13, 57 y.o.   MRN: [id number]  HPI Here for a well exam. He feels fine. He used to see his Urologist in [place] for BPH, but now we are treating this. His BP has been well controlled.    Review of Systems  Constitutional: Negative.   HENT: Negative.    Eyes: Negative.   Respiratory: Negative.    Cardiovascular: Negative.   Gastrointestinal: Negative.   Genitourinary: Negative.   Musculoskeletal: Negative.   Skin: Negative.   Neurological: Negative.   Psychiatric/Behavioral: Negative.         Objective:   Physical Exam Constitutional:      General: He is not in acute distress.    Appearance: Normal appearance. He is well-developed. He is obese. He is not diaphoretic.  HENT:     Head: Normocephalic and atraumatic.     Right Ear: External ear normal.     Left Ear: External ear normal.     Nose: Nose normal.     Mouth/Throat:     Pharynx: No oropharyngeal exudate.  Eyes:     General: No scleral icterus.       Right eye: No discharge.        Left eye: No discharge.     Conjunctiva/sclera: Conjunctivae normal.     Pupils: Pupils are equal, round, and reactive to light.  Neck:     Thyroid: No thyromegaly.     Vascular: No JVD.     Trachea: No tracheal deviation.  Cardiovascular:     Rate and Rhythm: Normal rate and regular rhythm.     Pulses: Normal pulses.     Heart sounds: Normal heart sounds. No murmur heard.    No friction rub. No gallop.  Pulmonary:     Effort: Pulmonary effort is normal. No respiratory distress.     Breath sounds: Normal breath sounds. No wheezing or rales.  Chest:     Chest wall: No tenderness.  Abdominal:     General: Bowel sounds are normal. There is no distension.     Palpations: Abdomen is soft. There is no mass.     Tenderness: There is no abdominal tenderness. There is no guarding or rebound.  Genitourinary:    Penis: Normal. No tenderness.      Testes: Normal.      Prostate: Normal.     Rectum: Normal. Guaiac result negative.  Musculoskeletal:        General: No tenderness. Normal range of motion.     Cervical back: Neck supple.  Lymphadenopathy:     Cervical: No cervical adenopathy.  Skin:    General: Skin is warm and dry.     Coloration: Skin is not pale.     Findings: No erythema or rash.  Neurological:     General: No focal deficit present.     Mental Status: He is alert and oriented to person, place, and time.     Cranial Nerves: No cranial nerve deficit.     Motor: No abnormal muscle tone.     Coordination: Coordination normal.     Deep Tendon Reflexes: Reflexes are normal and symmetric. Reflexes normal.  Psychiatric:        Mood and Affect: Mood normal.        Behavior: Behavior normal.        Thought Content: Thought content normal.        Judgment: Judgment normal.  Assessment & Plan:  Well exam. We discussed diet and exercise. Get fasting labs. Gershon Crane, MD

## 2023-09-25 ENCOUNTER — Encounter: Payer: Self-pay | Admitting: Advanced Practice Midwife

## 2024-01-11 ENCOUNTER — Encounter: Payer: Self-pay | Admitting: Family Medicine

## 2024-01-12 MED ORDER — METHYLPREDNISOLONE 4 MG PO TBPK: ORAL_TABLET | ORAL | 0 refills | Status: DC

## 2024-01-12 NOTE — Telephone Encounter (Signed)
 Done

## 2024-02-01 ENCOUNTER — Encounter: Admitting: Family Medicine

## 2024-02-09 ENCOUNTER — Ambulatory Visit: Payer: Self-pay | Admitting: Family Medicine

## 2024-02-09 ENCOUNTER — Ambulatory Visit: Admitting: Family Medicine

## 2024-02-09 VITALS — BP 118/76 | HR 53 | Temp 98.0°F | Ht 68.25 in | Wt 234.0 lb

## 2024-02-09 DIAGNOSIS — Z0000 Encounter for general adult medical examination without abnormal findings: Secondary | ICD-10-CM | POA: Diagnosis not present

## 2024-02-09 DIAGNOSIS — I10 Essential (primary) hypertension: Secondary | ICD-10-CM

## 2024-02-09 LAB — CBC WITH DIFFERENTIAL/PLATELET
Basophils Absolute: 0 K/uL (ref 0.0–0.1)
Basophils Relative: 0.6 % (ref 0.0–3.0)
Eosinophils Absolute: 0.2 K/uL (ref 0.0–0.7)
Eosinophils Relative: 2.9 % (ref 0.0–5.0)
HCT: 48.2 % (ref 39.0–52.0)
Hemoglobin: 16.3 g/dL (ref 13.0–17.0)
Lymphocytes Relative: 39 % (ref 12.0–46.0)
Lymphs Abs: 2.7 K/uL (ref 0.7–4.0)
MCHC: 33.9 g/dL (ref 30.0–36.0)
MCV: 87 fl (ref 78.0–100.0)
Monocytes Absolute: 0.6 K/uL (ref 0.1–1.0)
Monocytes Relative: 9.3 % (ref 3.0–12.0)
Neutro Abs: 3.3 K/uL (ref 1.4–7.7)
Neutrophils Relative %: 48.2 % (ref 43.0–77.0)
Platelets: 220 K/uL (ref 150.0–400.0)
RBC: 5.54 Mil/uL (ref 4.22–5.81)
RDW: 14.3 % (ref 11.5–15.5)
WBC: 6.9 K/uL (ref 4.0–10.5)

## 2024-02-09 LAB — LIPID PANEL
Cholesterol: 193 mg/dL (ref 0–200)
HDL: 39.5 mg/dL (ref >39.00)
LDL Cholesterol: 114 mg/dL — ABNORMAL HIGH (ref 0–99)
NonHDL: 153.54
Total CHOL/HDL Ratio: 5
Triglycerides: 196 mg/dL — ABNORMAL HIGH (ref 0.0–149.0)
VLDL: 39.2 mg/dL (ref 0.0–40.0)

## 2024-02-09 LAB — HEMOGLOBIN A1C: Hgb A1c MFr Bld: 5.5 % (ref 4.6–6.5)

## 2024-02-09 LAB — BASIC METABOLIC PANEL WITH GFR
BUN: 14 mg/dL (ref 6–23)
CO2: 28 meq/L (ref 19–32)
Calcium: 9 mg/dL (ref 8.4–10.5)
Chloride: 106 meq/L (ref 96–112)
Creatinine, Ser: 1.33 mg/dL (ref 0.40–1.50)
GFR: 58.73 mL/min — ABNORMAL LOW (ref >60.00)
Glucose, Bld: 98 mg/dL (ref 70–99)
Potassium: 3.9 meq/L (ref 3.5–5.1)
Sodium: 141 meq/L (ref 135–145)

## 2024-02-09 LAB — HEPATIC FUNCTION PANEL
ALT: 19 U/L (ref 0–53)
AST: 14 U/L (ref 0–37)
Albumin: 4.4 g/dL (ref 3.5–5.2)
Alkaline Phosphatase: 71 U/L (ref 39–117)
Bilirubin, Direct: 0.1 mg/dL (ref 0.0–0.3)
Total Bilirubin: 0.5 mg/dL (ref 0.2–1.2)
Total Protein: 6.7 g/dL (ref 6.0–8.3)

## 2024-02-09 LAB — PSA: PSA: 1.83 ng/mL (ref 0.10–4.00)

## 2024-02-09 LAB — TSH: TSH: 1.81 u[IU]/mL (ref 0.35–5.50)

## 2024-02-09 MED ORDER — METOPROLOL SUCCINATE ER 100 MG PO TB24: ORAL_TABLET | ORAL | 3 refills | Status: AC

## 2024-02-09 MED ORDER — LISINOPRIL 20 MG PO TABS: 20.0000 mg | ORAL_TABLET | Freq: Every day | ORAL | 3 refills | Status: AC

## 2024-02-09 MED ORDER — TAMSULOSIN HCL 0.4 MG PO CAPS: 0.4000 mg | ORAL_CAPSULE | Freq: Every day | ORAL | 3 refills | Status: AC

## 2024-02-09 NOTE — Progress Notes (Signed)
 Subjective:    Patient ID: Jeffrey Kennedy, male    DOB: [birth year], 58 y.o.   MRN: [id number]  HPI Here for a well exam. He feels well. 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    Review of Systems  Constitutional: Negative.   HENT: Negative.    Eyes: Negative.   Respiratory: Negative.    Cardiovascular: Negative.   Gastrointestinal: Negative.   Genitourinary: Negative.   Musculoskeletal: Negative.   Skin: Negative.   Neurological: Negative.   Psychiatric/Behavioral: Negative.         Objective:   Physical Exam Constitutional:      General: He is not in acute distress.    Appearance: Normal appearance. He is well-developed. He is not diaphoretic.  HENT:     Head: Normocephalic and atraumatic.     Right Ear: External ear normal.     Left Ear: External ear normal.     Nose: Nose normal.     Mouth/Throat:     Pharynx: No oropharyngeal exudate.  Eyes:     General: No scleral icterus.       Right eye: No discharge.        Left eye: No discharge.     Conjunctiva/sclera: Conjunctivae normal.     Pupils: Pupils are equal, round, and reactive to light.  Neck:     Thyroid : No thyromegaly.     Vascular: No JVD.     Trachea: No tracheal deviation.  Cardiovascular:     Rate and Rhythm: Normal rate and regular rhythm.     Pulses: Normal pulses.     Heart sounds: Normal heart sounds. No murmur heard.    No friction rub. No gallop.  Pulmonary:     Effort: Pulmonary effort is normal. No respiratory distress.     Breath sounds: Normal breath sounds. No wheezing or rales.  Chest:     Chest wall: No tenderness.  Abdominal:     General: Bowel sounds are normal. There is no distension.     Palpations: Abdomen is soft. There is no mass.     Tenderness: There is no abdominal tenderness. There is no guarding or rebound.     Comments: There is a small lipoma under the skin of the right flank   Genitourinary:    Penis: Normal. No tenderness.      Testes: Normal.      Prostate: Normal.     Rectum: Normal. Guaiac result negative.  Musculoskeletal:        General: No tenderness. Normal range of motion.     Cervical back: Neck supple.  Lymphadenopathy:     Cervical: No cervical adenopathy.  Skin:    General: Skin is warm and dry.     Coloration: Skin is not pale.     Findings: No erythema or rash.  Neurological:     General: No focal deficit present.     Mental Status: He is alert and oriented to person, place, and time.     Cranial Nerves: No cranial nerve deficit.     Motor: No abnormal muscle tone.     Coordination: Coordination normal.     Deep Tendon Reflexes: Reflexes are normal and symmetric. Reflexes normal.  Psychiatric:        Mood and Affect: Mood normal.        Behavior: Behavior normal.        Thought Content: Thought content normal.        Judgment: Judgment normal.  Assessment & Plan:  Well exam. We discussed diet and exercise. Get fasting labs. Garnette Olmsted, MD
